# Patient Record
Sex: Female | Born: 1970 | Hispanic: No | Marital: Single | Smoking: Current every day smoker
Health system: Southern US, Community
[De-identification: ages and names within clinical notes are randomized; demographics above are authoritative.]

## PROBLEM LIST (undated history)

## (undated) DIAGNOSIS — I10 Essential (primary) hypertension: Secondary | ICD-10-CM

## (undated) DIAGNOSIS — K219 Gastro-esophageal reflux disease without esophagitis: Secondary | ICD-10-CM

## (undated) DIAGNOSIS — J45909 Unspecified asthma, uncomplicated: Secondary | ICD-10-CM

## (undated) DIAGNOSIS — R0602 Shortness of breath: Secondary | ICD-10-CM

## (undated) DIAGNOSIS — M1990 Unspecified osteoarthritis, unspecified site: Secondary | ICD-10-CM

## (undated) DIAGNOSIS — T8859XA Other complications of anesthesia, initial encounter: Secondary | ICD-10-CM

## (undated) DIAGNOSIS — F32A Depression, unspecified: Secondary | ICD-10-CM

## (undated) DIAGNOSIS — I739 Peripheral vascular disease, unspecified: Secondary | ICD-10-CM

## (undated) DIAGNOSIS — K829 Disease of gallbladder, unspecified: Secondary | ICD-10-CM

## (undated) DIAGNOSIS — F410 Panic disorder [episodic paroxysmal anxiety] without agoraphobia: Secondary | ICD-10-CM

## (undated) DIAGNOSIS — I209 Angina pectoris, unspecified: Secondary | ICD-10-CM

## (undated) DIAGNOSIS — I509 Heart failure, unspecified: Secondary | ICD-10-CM

## (undated) DIAGNOSIS — F419 Anxiety disorder, unspecified: Secondary | ICD-10-CM

## (undated) DIAGNOSIS — I219 Acute myocardial infarction, unspecified: Secondary | ICD-10-CM

## (undated) DIAGNOSIS — R52 Pain, unspecified: Secondary | ICD-10-CM

## (undated) DIAGNOSIS — F329 Major depressive disorder, single episode, unspecified: Secondary | ICD-10-CM

## (undated) DIAGNOSIS — E119 Type 2 diabetes mellitus without complications: Secondary | ICD-10-CM

## (undated) DIAGNOSIS — I2510 Atherosclerotic heart disease of native coronary artery without angina pectoris: Secondary | ICD-10-CM

## (undated) DIAGNOSIS — C801 Malignant (primary) neoplasm, unspecified: Secondary | ICD-10-CM

## (undated) DIAGNOSIS — T4145XA Adverse effect of unspecified anesthetic, initial encounter: Secondary | ICD-10-CM

## (undated) DIAGNOSIS — F319 Bipolar disorder, unspecified: Secondary | ICD-10-CM

## (undated) HISTORY — PX: CORONARY ANGIOPLASTY: SHX604

## (undated) HISTORY — PX: TUBAL LIGATION: SHX77

## (undated) HISTORY — PX: WRIST SURGERY: SHX841

## (undated) HISTORY — PX: CARDIAC CATHETERIZATION: SHX172

## (undated) HISTORY — PX: DENTAL SURGERY: SHX609

## (undated) HISTORY — PX: TMJ ARTHROPLASTY: SHX1066

---

## 1997-05-24 ENCOUNTER — Ambulatory Visit (HOSPITAL_COMMUNITY): Admission: RE | Admit: 1997-05-24 | Discharge: 1997-05-24 | Payer: Self-pay | Admitting: *Deleted

## 1997-09-09 ENCOUNTER — Ambulatory Visit (HOSPITAL_COMMUNITY): Admission: RE | Admit: 1997-09-09 | Discharge: 1997-09-09 | Payer: Self-pay | Admitting: *Deleted

## 1997-09-24 ENCOUNTER — Inpatient Hospital Stay (HOSPITAL_COMMUNITY): Admission: AD | Admit: 1997-09-24 | Discharge: 1997-09-24 | Payer: Self-pay | Admitting: *Deleted

## 1997-10-01 ENCOUNTER — Inpatient Hospital Stay (HOSPITAL_COMMUNITY): Admission: RE | Admit: 1997-10-01 | Discharge: 1997-10-01 | Payer: Self-pay | Admitting: *Deleted

## 1997-10-08 ENCOUNTER — Inpatient Hospital Stay (HOSPITAL_COMMUNITY): Admission: RE | Admit: 1997-10-08 | Discharge: 1997-10-08 | Payer: Self-pay | Admitting: *Deleted

## 1997-10-16 ENCOUNTER — Other Ambulatory Visit: Admission: RE | Admit: 1997-10-16 | Discharge: 1997-10-16 | Payer: Self-pay | Admitting: *Deleted

## 1997-10-22 ENCOUNTER — Inpatient Hospital Stay (HOSPITAL_COMMUNITY): Admission: AD | Admit: 1997-10-22 | Discharge: 1997-10-25 | Payer: Self-pay | Admitting: Obstetrics

## 2000-04-18 ENCOUNTER — Emergency Department (HOSPITAL_COMMUNITY): Admission: EM | Admit: 2000-04-18 | Discharge: 2000-04-18 | Payer: Self-pay | Admitting: Emergency Medicine

## 2000-12-27 ENCOUNTER — Inpatient Hospital Stay (HOSPITAL_COMMUNITY): Admission: EM | Admit: 2000-12-27 | Discharge: 2000-12-29 | Payer: Self-pay | Admitting: *Deleted

## 2000-12-27 ENCOUNTER — Encounter: Payer: Self-pay | Admitting: Emergency Medicine

## 2001-03-08 ENCOUNTER — Emergency Department (HOSPITAL_COMMUNITY): Admission: EM | Admit: 2001-03-08 | Discharge: 2001-03-08 | Payer: Self-pay

## 2001-03-24 ENCOUNTER — Inpatient Hospital Stay (HOSPITAL_COMMUNITY): Admission: EM | Admit: 2001-03-24 | Discharge: 2001-03-26 | Payer: Self-pay | Admitting: Emergency Medicine

## 2001-03-24 ENCOUNTER — Encounter: Payer: Self-pay | Admitting: Emergency Medicine

## 2001-05-14 ENCOUNTER — Emergency Department (HOSPITAL_COMMUNITY): Admission: EM | Admit: 2001-05-14 | Discharge: 2001-05-14 | Payer: Self-pay | Admitting: Emergency Medicine

## 2001-05-23 ENCOUNTER — Inpatient Hospital Stay (HOSPITAL_COMMUNITY): Admission: EM | Admit: 2001-05-23 | Discharge: 2001-05-25 | Payer: Self-pay | Admitting: Emergency Medicine

## 2001-05-23 ENCOUNTER — Encounter: Payer: Self-pay | Admitting: Cardiology

## 2001-07-13 ENCOUNTER — Emergency Department (HOSPITAL_COMMUNITY): Admission: EM | Admit: 2001-07-13 | Discharge: 2001-07-13 | Payer: Self-pay | Admitting: *Deleted

## 2001-07-14 ENCOUNTER — Encounter: Payer: Self-pay | Admitting: Emergency Medicine

## 2001-10-05 ENCOUNTER — Ambulatory Visit (HOSPITAL_COMMUNITY): Admission: RE | Admit: 2001-10-05 | Discharge: 2001-10-05 | Payer: Self-pay | Admitting: Cardiology

## 2001-12-12 ENCOUNTER — Emergency Department (HOSPITAL_COMMUNITY): Admission: EM | Admit: 2001-12-12 | Discharge: 2001-12-12 | Payer: Self-pay | Admitting: Emergency Medicine

## 2002-01-02 ENCOUNTER — Emergency Department (HOSPITAL_COMMUNITY): Admission: EM | Admit: 2002-01-02 | Discharge: 2002-01-02 | Payer: Self-pay | Admitting: Emergency Medicine

## 2002-07-04 ENCOUNTER — Observation Stay (HOSPITAL_COMMUNITY): Admission: EM | Admit: 2002-07-04 | Discharge: 2002-07-05 | Payer: Self-pay | Admitting: Emergency Medicine

## 2002-07-04 ENCOUNTER — Encounter: Payer: Self-pay | Admitting: Emergency Medicine

## 2002-07-05 ENCOUNTER — Encounter: Payer: Self-pay | Admitting: Internal Medicine

## 2003-10-29 ENCOUNTER — Emergency Department (HOSPITAL_COMMUNITY): Admission: EM | Admit: 2003-10-29 | Discharge: 2003-10-29 | Payer: Self-pay | Admitting: Emergency Medicine

## 2003-12-03 ENCOUNTER — Inpatient Hospital Stay (HOSPITAL_COMMUNITY): Admission: EM | Admit: 2003-12-03 | Discharge: 2003-12-06 | Payer: Self-pay | Admitting: *Deleted

## 2003-12-19 ENCOUNTER — Ambulatory Visit: Payer: Self-pay | Admitting: Cardiology

## 2004-01-07 ENCOUNTER — Emergency Department (HOSPITAL_COMMUNITY): Admission: EM | Admit: 2004-01-07 | Discharge: 2004-01-07 | Payer: Self-pay | Admitting: Emergency Medicine

## 2004-03-02 ENCOUNTER — Emergency Department (HOSPITAL_COMMUNITY): Admission: EM | Admit: 2004-03-02 | Discharge: 2004-03-02 | Payer: Self-pay | Admitting: Emergency Medicine

## 2006-01-22 ENCOUNTER — Ambulatory Visit: Payer: Self-pay | Admitting: Cardiovascular Disease

## 2006-01-22 ENCOUNTER — Inpatient Hospital Stay (HOSPITAL_COMMUNITY): Admission: EM | Admit: 2006-01-22 | Discharge: 2006-01-24 | Payer: Self-pay | Admitting: Emergency Medicine

## 2006-02-14 ENCOUNTER — Ambulatory Visit: Payer: Self-pay | Admitting: Cardiology

## 2006-03-07 ENCOUNTER — Ambulatory Visit: Payer: Self-pay | Admitting: Cardiology

## 2006-05-18 ENCOUNTER — Ambulatory Visit: Payer: Self-pay | Admitting: Internal Medicine

## 2006-06-02 ENCOUNTER — Ambulatory Visit: Payer: Self-pay | Admitting: Cardiology

## 2007-02-24 ENCOUNTER — Emergency Department (HOSPITAL_COMMUNITY): Admission: EM | Admit: 2007-02-24 | Discharge: 2007-02-24 | Payer: Self-pay | Admitting: Emergency Medicine

## 2008-09-04 ENCOUNTER — Emergency Department (HOSPITAL_COMMUNITY): Admission: EM | Admit: 2008-09-04 | Discharge: 2008-09-04 | Payer: Self-pay | Admitting: Emergency Medicine

## 2009-01-24 ENCOUNTER — Ambulatory Visit (HOSPITAL_COMMUNITY): Admission: RE | Admit: 2009-01-24 | Discharge: 2009-01-24 | Payer: Self-pay | Admitting: Orthopaedic Surgery

## 2009-02-07 ENCOUNTER — Ambulatory Visit (HOSPITAL_COMMUNITY): Admission: RE | Admit: 2009-02-07 | Discharge: 2009-02-07 | Payer: Self-pay | Admitting: Orthopaedic Surgery

## 2010-06-10 ENCOUNTER — Emergency Department (HOSPITAL_COMMUNITY): Payer: Medicaid Other

## 2010-06-10 ENCOUNTER — Emergency Department (HOSPITAL_COMMUNITY)
Admission: EM | Admit: 2010-06-10 | Discharge: 2010-06-10 | Discharge status: Against Medical Advice | Payer: Medicaid Other | Attending: Emergency Medicine | Admitting: Emergency Medicine

## 2010-06-10 DIAGNOSIS — R059 Cough, unspecified: Secondary | ICD-10-CM | POA: Insufficient documentation

## 2010-06-10 DIAGNOSIS — R05 Cough: Secondary | ICD-10-CM | POA: Insufficient documentation

## 2010-06-10 DIAGNOSIS — I2510 Atherosclerotic heart disease of native coronary artery without angina pectoris: Secondary | ICD-10-CM | POA: Insufficient documentation

## 2010-06-10 DIAGNOSIS — I10 Essential (primary) hypertension: Secondary | ICD-10-CM | POA: Insufficient documentation

## 2010-06-10 DIAGNOSIS — I252 Old myocardial infarction: Secondary | ICD-10-CM | POA: Insufficient documentation

## 2010-06-10 DIAGNOSIS — R0789 Other chest pain: Secondary | ICD-10-CM | POA: Insufficient documentation

## 2010-06-10 LAB — POCT CARDIAC MARKERS
Myoglobin, poc: 40.2 ng/mL (ref 12–200)
Troponin i, poc: <0.05 ng/mL (ref 0.00–0.09)

## 2010-06-10 LAB — COMPREHENSIVE METABOLIC PANEL
ALT: 31 U/L (ref 0–35)
AST: 31 U/L (ref 0–37)
BUN: 13 mg/dL (ref 6–23)
CO2: 21 mEq/L (ref 19–32)
Calcium: 8.7 mg/dL (ref 8.4–10.5)
Chloride: 106 mEq/L (ref 96–112)
Creatinine, Ser: 0.79 mg/dL (ref 0.4–1.2)
Glucose, Bld: 124 mg/dL — ABNORMAL HIGH (ref 70–99)
Total Protein: 6.9 g/dL (ref 6.0–8.3)

## 2010-06-10 LAB — DIFFERENTIAL
Basophils Absolute: 0 10*3/uL (ref 0.0–0.1)
Lymphocytes Relative: 31 % (ref 12–46)
Monocytes Relative: 7 % (ref 3–12)
Neutrophils Relative %: 60 % (ref 43–77)

## 2010-06-10 LAB — CBC
MCHC: 34.6 g/dL (ref 30.0–36.0)
Platelets: 201 10*3/uL (ref 150–400)
RBC: 5.1 MIL/uL (ref 3.87–5.11)
WBC: 6.9 10*3/uL (ref 4.0–10.5)

## 2010-06-10 LAB — ETHANOL: Alcohol, Ethyl (B): <5 mg/dL (ref 0–10)

## 2010-06-10 LAB — RAPID URINE DRUG SCREEN, HOSP PERFORMED: Amphetamines: NOT DETECTED

## 2010-06-24 LAB — BASIC METABOLIC PANEL
BUN: 8 mg/dL (ref 6–23)
CO2: 25 mEq/L (ref 19–32)
Calcium: 9.1 mg/dL (ref 8.4–10.5)
Chloride: 108 mEq/L (ref 96–112)
Creatinine, Ser: 0.78 mg/dL (ref 0.4–1.2)
GFR calc Af Amer: >60 mL/min (ref >60)
Glucose, Bld: 119 mg/dL — ABNORMAL HIGH (ref 70–99)
Potassium: 3.6 mEq/L (ref 3.5–5.1)
Sodium: 142 mEq/L (ref 135–145)

## 2010-06-24 LAB — CBC
HCT: 45.6 % (ref 36.0–46.0)
HCT: 47.1 % — ABNORMAL HIGH (ref 36.0–46.0)
Hemoglobin: 15.8 g/dL — ABNORMAL HIGH (ref 12.0–15.0)
MCHC: 34.7 g/dL (ref 30.0–36.0)
MCV: 94.9 fL (ref 78.0–100.0)
MCV: 94.6 fL (ref 78.0–100.0)
RBC: 4.81 MIL/uL (ref 3.87–5.11)
RDW: 13.1 % (ref 11.5–15.5)
WBC: 7.3 10*3/uL (ref 4.0–10.5)

## 2010-08-07 NOTE — Cardiovascular Report (Signed)
NAME:  Christine Davidson, Christine Davidson                          ACCOUNT NO.:  [id number]   MEDICAL RECORD NO.:  [id number]                   PATIENT TYPE:  INP   LOCATION:  2923                                 FACILITY:  [facility]   PHYSICIAN:  Salvadore Farber, M.D. [facility]         DATE OF BIRTH:  1971-02-07   DATE OF PROCEDURE:  12/03/2003  DATE OF DISCHARGE:                              CARDIAC CATHETERIZATION   PROCEDURE:  Left heart catheterization, left ventriculogram, coronary  angiography.   INDICATIONS FOR PROCEDURE:  Christine Davidson is a 40 year old lady with coronary  artery disease status post stenting of her circumflex in 2002 who now  presents with acute inferior myocardial infarction.  The pain onset was at  noon.  ER presentation was at 120 p.m.  She has ongoing chest discomfort and  electrocardiogram shows inferior ST elevations with reciprocal changes  anteriorly.  She is brought to the cardiac cath lab for urgent  catheterization with an eye to percutaneous revascularization.   PROCEDURE TECHNIQUE:  Informed consent was obtained.  Under 1% lidocaine  local anesthesia, a 6 French sheaths were placed in the right common femoral  artery and vein using the modified Seldinger technique.  Diagnostic  angiography was performed of the left system using a JL4 catheter and of the  right system using a 6 [place] JR4 guide.  These demonstrated subtotal  occlusion of the proximal RCA with TIMI II flow and heavy thrombus burden.   The patient had been initiated on heparin in the emergency room  and arrived  with an ACT of 260.  Double bolus eptifibatide was administered upon arrival  to the cardiac catheterization lab.  The lesion was crossed with a Luge wire  without difficulty.  It was then predilated using a 2 by 12 mm Maverick at 6  atmospheres for two inflations.  There remained heavy residual thrombus  burden.  An export catheter was then used to perform thrombectomy.  Several  moderate size pieces  of atherosclerotic debris were removed.  There was  moderate residual thrombus after this.  The lesion was then stented using a  3 by 23 mm Vision deployed at 16 atmospheres (bare metal stent was used due  to patient's history of noncompliance with medical therapy and substance  abuse).  The stent was then post dilated using a 3.25 by 18 mm Powersail at  16 atmospheres for two inflations.  Final angiography demonstrated no  residual stenosis and TIMI III flow to the distal vasculature.  The pigtail  catheter was then used to perform left heart catheterization and  ventriculography.  The patient tolerated the procedure well and was  transferred to the cardiac intensive care unit in stable condition.   COMPLICATIONS:  None.   FINDINGS:  1.  Left ventricle:  120/16/20. EF 45% with posterobasal akinesis.  2.  No mitral regurgitation or aortic stenosis.  3.  Left main:  Angiographically normal.  4.  LAD:  Moderate size vessel giving rise to a single diagonal.  It is      angiographically normal.  5.  Ramus intermedius:  Moderate size vessel which is angiographically      normal.  6.  Circumflex:  Moderate size vessel giving rise to a single obtuse      marginal.  This marginal has a stent in its mid section.  The stent is      widely patent with less than 20% stent restenosis.  7.  Right coronary artery:  Large, dominant vessel.  There was 99%      thrombotic stenosis in the proximal vessel overlapping the take off of      the sole acute marginal.  This lesion was stented to no residual      stenosis with establishment of TIMI III flow.   IMPRESSION/PLAN:  Successful bare metal stent placement in the culprit  lesion of the RCA.  The patient will be continued on eptifibatide for 18  hours.  Plavix should be continued for a minimum of four weeks, preferably  for one year.  She will be admitted to the cardiac intensive care unit where  she will be continued on aspirin, Plavix, eptifibatide.   Statin and beta  blocker will be initiated.  Urine toxicology screen will be sent.                                               Salvadore Farber, M.D. [facility]    WED/MEDQ  D:  12/03/2003  T:  12/03/2003  Job:  [id number]   cc:   Charlies Constable, M.D. [facility]

## 2010-08-07 NOTE — H&P (Signed)
NAME:  Christine Davidson, Christine Davidson                          ACCOUNT NO.:  [id number]   MEDICAL RECORD NO.:  [id number]                   PATIENT TYPE:  INP   LOCATION:  1823                                 FACILITY:  [facility]   PHYSICIAN:  Salvadore Farber, M.D. [facility]         DATE OF BIRTH:  1970-08-23   DATE OF ADMISSION:  12/03/2003  DATE OF DISCHARGE:                                HISTORY & PHYSICAL   CHIEF COMPLAINT:  Chest pain.   BRIEF HISTORY:  This is a 40 year old female who presented to [facility] Emergency Room today for evaluation of chest pain.  Her EKG was  consistent with an acute MI.  The patient identifies Dr. Charlies Constable as her  cardiologist.  She has had a previous MI, details pending.  The patient's  pain started around noon today.  Apparently, she was having an argument with  her husband.  She developed chest pain.  The patient is unable to give much  history.  She is agitated at this time as well as having nausea and  vomiting.  Much of the history is obtained from her cousin.  Apparently, the  patient has a substantial psychiatric history as well as a history of  substance abuse.  She will be taken directly to the cardiac catheterization  laboratory.   PAST MEDICAL HISTORY:  Significant for coronary artery disease with previous  lateral wall MI in October 2002, status post stenting.  She had a  percutaneous intervention in March 2003, as well.  Details are pending.  She  has a history of hypertension, ongoing tobacco use, psychiatric history with  anxiety and depression, and a history of gastroesophageal reflux disease.   ALLERGIES:  MORPHINE causes ARM SWELLING and WELTS.  She states she is  ALLERGIC to MOST PAIN MEDICATIONS, except DILAUDID.  She is also ALLERGIC to  CODEINE AND VICODIN.   MEDICATIONS:  Prior to admission, unclear.  Apparently, she was on:  1.  Aspirin.  2.  Prevacid.  3.  Seroquel.  4.  Remeron.   Dosages not available.   SOCIAL  HISTORY:  The patient lives in [place].  She has one daughter.  She is  married.  She smokes a pack of cigarettes per day.  She does not use  alcohol.  She does not work.   FAMILY HISTORY:  The patient's father is living.  He is in his 40s.  He has  had previous MIs, diabetes mellitus, and coronary artery bypass graft  surgery.  Her mother is in her 26s.  She is alive and well.  She has two  brothers alive and well.   REVIEW OF SYSTEMS:  Unobtainable.   PHYSICAL EXAMINATION:  GENERAL:  Per Dr. Samule Ohm, revealed an extremely  anxious, agitated, Spanish-appearing 40 year old female.  VITAL SIGNS:  Blood pressure 120/80, pulse 70s.  HEENT:  Unremarkable.  NECK:  Reveals no bruits, no jugular venous distention.  HEART:  Reveals regular rate and rhythm without murmur.  LUNGS:  Clear.  ABDOMEN:  Obese, soft, nontender.  EXTREMITIES:  Reveal pulses to be intact without significant edema.  NEUROLOGIC:  Grossly intact.   LABORATORY DATA:  An EKG shows sinus rhythm, rate 75 beats per minute with  inferior ST elevation.  A CBC reveals hemoglobin 16.4, hematocrit 46, WBC  12.2000, platelets 260,000.  Other laboratories are pending at this time.   IMPRESSION:  1.  Chest pain, probable acute inferior myocardial infarction.  2.  History of previous coronary artery disease as documented above.  3.  History of hypertension.  4.  History of tobacco use.  5.  History of substance abuse including marijuana and cocaine.  6.  Psychiatric history with history of anxiety and depression.  7.  Gastroesophageal reflux disease.   PLAN:  The patient will be taken to the cardiac catheterization laboratory  for catheterization and possible intervention.  I have tried to call her  psychiatrist or the physician she identifies as her psychiatrist, Dr. Elna Breslow.  He apparently is no longer in practice.  We will start her back on  Seroquel and Remeron at low dosages.  We will continue her Prevacid and  aspirin.   We will not use a beta blocker due to a history of cocaine use.      Delton See, P.A. [facility]                  Salvadore Farber, M.D. [facility]    DR/MEDQ  D:  12/03/2003  T:  12/03/2003  Job:  [id number]

## 2010-08-07 NOTE — Discharge Summary (Signed)
[place]. [facility]  Patient:    Christine Davidson, Christine Davidson Visit Number: [id number] MRN: [id number]          Service Type: MED Location: [phone] Attending Physician:  Mirian Mo Dictated by:   Rozell Searing, P.A. Admit Date:  03/24/2001 Discharge Date: 03/26/2001                             Discharge Summary  PROCEDURES: None.  HISTORY OF PRESENT ILLNESS: Ms. Cushman is a 40 year old female, status post recent lateral myocardial infarction, treated with stenting of the OM, in the setting of cocaine use, who presented with recurrent chest discomfort.  HOSPITAL COURSE: Drug screen was positive for benzodiazepines and cannabis, but negative for cocaine.  Serial cardiac enzymes were negative.  The patient was initially treated with IV nitroglycerin with reported relief. However, following rule out of MI plan was to proceed with conservative management and the patient had no recurrent chest pain once IV nitroglycerin was discontinued.  MEDICATION ADJUSTMENTS: Addition of Nicotrol patch to assist the patient in stopping tobacco smoking.  FOLLOW-UP: We will make arrangements for an outpatient Cardiolite prior to follow-up with Dr. Antoine Poche, the patients primary cardiologist.  The patient will be contacted by our office for an outpatient stress Cardiolite in the next two weeks.  She will then follow up with Dr. Antoine Poche.  DISCHARGE MEDICATIONS:  1. Plavix 75 mg q.d.  2. Aspirin 81 mg q.d.  3. Norvasc 5 mg q.d.  4. Zoloft 75 mg q.d.  5. Xanax as previously directed.  6. Nicotrol patch as directed instructions.  DISCHARGE DIAGNOSES:  1. Nonischemic chest pain.     a. Normal serial cardiac enzymes.  2. Coronary artery disease.     a. Status post lateral myocardial infarction/stent to obtuse marginal        October 2002.     b. Positive cocaine.  3. Polysubstance abuse.  4. Tobacco.  5. Anxiety. Dictated by:   Rozell Searing, P.A. Attending  Physician:  Mirian Mo DD:  03/26/01 TD:  03/26/01 Job: [id number]

## 2010-08-07 NOTE — Discharge Summary (Signed)
NAME:  Christine Davidson, Christine Davidson                          ACCOUNT NO.:  [id number]   MEDICAL RECORD NO.:  [id number]                   PATIENT TYPE:  INP   LOCATION:  2030                                 FACILITY:  [facility]   PHYSICIAN:  Salvadore Farber, M.D. [facility]         DATE OF BIRTH:  [birth year]   DATE OF ADMISSION:  12/03/2003  DATE OF DISCHARGE:  12/06/2003                           DISCHARGE SUMMARY - REFERRING   SUMMARY OF HISTORY:  Christine Davidson is a 40 year old female who presents to  [facility] Emergency Room complaining of chest discomfort.  She got in an  argument with her husband and developed chest discomfort associated with  nausea and vomiting.  She was very agitated at the time of evaluation.  She  was unable to give much history.  She describes it as a crushing sensation  also associated with diaphoresis.  EKG showed an acute MI.  Her history is  notable for hypertension, prior myocardial infarction and intervention with  Dr. Juanda Chance, continued tobacco use, and prior drug history.   LABORATORY DATA:  Fasting lipids showed a total cholesterol 166,  triglycerides 92, HDL 51, LDL 97.  Admission CK total was 541 with an MB of  68.7, relative index 12.7.  Sodium 138, potassium 3.7, BUN 9, creatinine  0.9.  H&H 15.3 and 45.0.  Subsequent chemistries and hematologies during her  admission were unremarkable.  Second CK total was 973 with an MB of 172.5.  EKG on admission showed normal sinus rhythm, normal axis, ST segment  elevations inferiorly with ST segment elevations in V1 and V2.  Subsequent  EKGs showed evolving inferior myocardial infarction.   HOSPITAL COURSE:  [name] was taken emergently to the catheterization  laboratory by Dr. Samule Ohm.  According to his progress note EF was 45% with  posterobasilar akinesis.  She had a stent to a circumflex branch that was  widely patent.  She had a 99% proximal RCA lesion with thrombus.  Utilizing  stent this was reduced to 0%.  Post  catheterization site was intact per  Lavella Hammock.  Medications were adjusted.  She was admitted to the  coronary care unit.  Overnight she did not have any further discomfort.  She  ruled in for myocardial infarction by enzymes and EKG.  Medications  continued to be adjusted.  Cardiac rehabilitation assisted with education  and ambulation.  Smoking cessation consult was also performed.  She was  transferred to 2000 on September 15.  At the time of the transfer it was  noted that she probably had a slight amount of mild congestive failure and  she was treated with some diuretics with improvement.  By September 16 Dr.  Gerri Spore reviewed the patient and it was felt that she could be discharged  home.  At the time of discharge H&H was 15.4 and 40.6, platelets 213, WBC  7.2.  Sodium 140, potassium 3.5, BUN 12, creatinine 0.8, glucose 98.  DISCHARGE DIAGNOSES:  1.  Acute inferior myocardial infarction status post stenting to the      proximal right coronary artery with residual ejection fraction of 45%      with posterobasilar akinesis.  2.  Hypertension.  3.  Tobacco use.  4.  Anxiety.  5.  Depression.  6.  Hyperlipidemia.  7.  Mild congestive heart failure.  8.  Drug use as urine drug screen was positive for THC and benzodiazepines.   DISPOSITION:  Patient is discharged home.  She received new prescriptions  for Plavix 75 mg daily, Lipitor 80 mg daily, Lopressor 50 mg half tablet  b.i.d., enalapril 5 mg daily, and nitroglycerin 0.4 as needed.  She was  given permission to continue her aspirin 325 daily, Seroquel 25 b.i.d.,  Remeron 15 mg q.h.s.  She was advised no lifting, driving, sexual activity,  or heavy exertion for the next two weeks.  Maintain a low salt, fat,  cholesterol diet.  If she has any problems with her catheterization site she  was asked to call us immediately.  She was advised no smoking or tobacco  products and to avoid any illicit drugs.  She will follow up with  Dr. Juanda Chance  on September 29 at 10:45 p.m.  At the time of follow-up with Dr. Juanda Chance  determination of the duration of the Plavix should be decided.  Fasting  lipids and LFTs will need to be arranged in the next six to eight weeks  since Lipitor was initiated.  It is of note that the patient also needs to  find a primary care physician for continued follow-up.  She is considering  the internal medicine clinic at [facility].       EW/MEDQ  D:  12/06/2003  T:  12/06/2003  Job:  [id number]   cc:   Carole Binning, M.D. [facility]   Charlies Constable, M.D. [facility]

## 2010-08-07 NOTE — H&P (Signed)
[place]. [facility]  Patient:    Christine Davidson, Christine Davidson Visit Number: [id number] MRN: [id number]          Service Type: MED Location: 2000 2025 01 Attending Physician:  Rollene Rotunda Dictated by:   Noralyn Pick. Eden Emms, M.D. [facility] Admit Date:  05/23/2001                           History and Physical  HISTORY OF PRESENT ILLNESS:  [name] is a 40 year old patient of Dr. Algis Downs Karle Plumber and Dr. Rollene Rotunda.  She has had previous circumflex infarction in October 2002 due to an OM occlusion.  At the time, she was positive for cocaine.  Her hospital stay at that time was otherwise unremarkable.  She denies recent drug use.  She awoke today with chest pain similar to her previous pain.  It was relieved with nitroglycerin x2 but returned.  She came to the emergency room here and had relief with intravenous nitroglycerin.  PAST MEDICAL HISTORY:  Her past medical history is otherwise remarkable for anxiety.  MEDICATIONS: 1. Zoloft 100 mg a day. 2. Norvasc 5 mg a day. 3. An aspirin a day. 4. Plavix 75 mg a day. 5. Xanax p.r.n.  ALLERGIES:  She is allergic to MORPHINE and CODEINE.  PHYSICAL EXAMINATION:  GENERAL:  The patient on admission was pain-free.  VITAL SIGNS:  Heart rate was 72, blood pressure was 130/80.  LUNGS:  Clear.  CARDIAC:  Carotids normal.  There was an S1 and S2 without murmur, rub, gallop or click.  ABDOMEN:  Benign.  EXTREMITIES:  Lower extremities with intact pulses and no edema.  LABORATORY AND ACCESSORY DATA:  EKG showed sinus rhythm with no acute changes.  Labs are pending at this time.  IMPRESSION:  Recurrent chest pain.  The patient continues to smoke.  She has had a previous obtuse marginal infarction in October 2002.  She will need repeat heart catheterization to further assess the patency of her stent.  We will treat her with Lovenox and continue beta blocker therapy with Lopressor 25 mg b.i.d.  She will be ruled out  for myocardial infarction and we will get a smoking cessation consult. Dictated by:   Noralyn Pick Eden Emms, M.D. [facility] Attending Physician:  Rollene Rotunda DD:  05/23/01 TD:  05/23/01 Job: [id number]

## 2010-08-07 NOTE — Discharge Summary (Signed)
[place]. [facility]  Patient:    Christine Davidson, Christine Davidson Visit Number: [id number] MRN: [id number]          Service Type: MED Location: [phone] Attending Physician:  Rollene Rotunda Dictated by:   Guy Franco, P.A.-C. Admit Date:  05/23/2001 Disc. Date: 05/25/01                    Referring Physician Discharge [place]  DISCHARGE DIAGNOSES: 1. Coronary artery disease, status post PCI to circumflex lesion on    May 24, 2001, by Dr. Veneda Melter. 2. Substance abuse. 3. Anxiety disorder.  HISTORY:  [name] is a 40 year old female with known coronary artery disease.  HOSPITAL COURSE:  She was admitted on May 23, 2001, with chest pain and did rule out for myocardial infarction.  Her EKG revealed normal sinus rhythm, otherwise normal.  Her catheterization on May 24, 2001, by Dr. Veneda Melter revealed normal right coronary artery, normal LAD, circumflex with a large OM-1 with a 70% in-stent restenosis.  She underwent PCI of this lesion to a 0% stenosis postprocedure with TIMI 3 flow.  She remained in the hospital overnight for observation.  She remained stable and upon discharge, was able to ambulate without pain or discomfort.  LABORATORY STUDIES:  BUN 4, creatinine 0.5.  Sodium 140, potassium 3.8. Hemoglobin 14.3, hematocrit 41.0, platelets 161, and white count 6.2.  She was discharged to home on May 25, 2001, in stable condition.  DISCHARGE MEDICATIONS: 1. Zoloft 100 mg q.d. 2. Enteric-coated aspirin 325 mg 1 p.o. q.d. 3. Plavix 75 mg 1 q.d. 4. Xanax p.r.n. 5. Seroquel 300 mg q.h.s. 6. Lopressor 25 mg 1 p.o. b.i.d. 7. Nitroglycerin p.r.n. chest pain.  She was on Norvasc 5 mg q.d. prior to her admission.  However, this has been switched over to a beta blocker.  If her blood pressure elevates in the office, she will need to be restarted on her Norvasc.  DISCHARGE INSTRUCTIONS: 1. No strenuous activity for two days and then resume regular  activity. 2. Remain on a low fat diet. 3. Clean over her cath site with soap and water. 4. Call for any questions or concern.  FOLLOW-UP:  She has a follow-up appointment with me on June 08, 2001, at 3 p.m. Dictated by:   Guy Franco, P.A.-C. Attending Physician:  Rollene Rotunda DD:  05/25/01 TD:  05/25/01 Job: [id number]

## 2010-08-07 NOTE — Cardiovascular Report (Signed)
[place]. [facility]  Patient:    Christine Davidson, Christine Davidson Visit Number: [id number] MRN: [id number]          Service Type: MED Location: 6500 [phone] Attending Physician:  Rollene Rotunda Dictated by:   Noralyn Pick. Eden Emms, M.D. [facility] Admit Date:  05/23/2001   CC:         Rollene Rotunda, M.D. [facility]   Cardiac Catheterization  PROCEDURE:  Coronary arteriography.  INDICATIONS:  Recurrent chest pain status post stenting of the OM branch in October 2002.  Patient continues to smoke.  DESCRIPTION OF PROCEDURE:  Catheterization was done from the right femoral artery.  A JR 3.5 catheter was used for the right coronary artery.  RESULTS: 1. The left main coronary artery was normal.  2. The left anterior descending artery was normal.  3. The circumflex coronary artery had a 50-60% tubular in-stent restenosis of    a large obtuse marginal branch.  4. The right coronary artery had 30% multiple discrete lesions in the mid    vessel.  The right coronary artery was dominant.  RAO ventriculography:  RAO ventriculography was normal.  Ejection fraction was in the 60% range.  There was no gradient across the aortic valve and no MR. Aortic pressures were in the 129/72 range.  LV pressure was in the 130/15 range.  IMPRESSION:  Films were reviewed with Dr. Chales Abrahams.  He agreed that this early on, it would be worthwhile to re-dilate the in-stent restenosis in the OM.  Th e patient needs to discontinue her smoking habits.  Hopefully things will go well and she will be discharged in the morning.  She will follow up with Dr. Antoine Poche. Dictated by:   Noralyn Pick Eden Emms, M.D. [facility] Attending Physician:  Rollene Rotunda DD:  05/24/01 TD:  05/24/01 Job: [id number]

## 2010-08-07 NOTE — Procedures (Signed)
[place]. [facility]  Patient:    Christine Davidson, Christine Davidson Visit Number: [id number] MRN: [id number]          Service Type: MED Location: 6500 [id number] [id number] Attending Physician:  Rollene Rotunda Dictated by:   Veneda Melter, M.D. [facility] Proc. Date: 05/24/01 Admit Date:  05/23/2001   CC:         Rollene Rotunda, M.D. [facility]   Procedure Report  PROCEDURES PERFORMED:  Percutaneous transluminal coronary angioplasty of the first marginal branch of the left circumflex artery.  DIAGNOSES: 1. Coronary artery disease. 2. In-stent restenosis. 3. Unstable angina.  HISTORY:  Christine Davidson is a 40 year old black female with a known history of coronary artery disease who previously underwent percutaneous intervention with stent placement to a large first marginal branch of the left circumflex artery in October 2002.  She presents now with recurrence of discomfort and was admitted to the hospital with unstable angina.  She underwent cardiac catheterization by Dr. Eden Emms showing in-stent restenosis of 70%.  She presents now for percutaneous intervention.  DESCRIPTION OF PROCEDURE:  Informed consent was obtained.  The patient was brought to the catheterization lab.  The existing #6 [place] sheath was present in the right femoral artery; however, oozing was noted around the sheath and this was exchanged for a #7 [place] sheath.  She was given heparin on a weight-adjusted basis to maintain an ACT greater than 250 seconds.  A #6 [place] Voda left 3.5 guide catheter was then used to engage the left coronary artery and selective guide shots obtained which confirmed the presence of 70% in-stent restenosis in a large first marginal branch of the left circumflex artery.  A 0.014 Extra Sport wire was advanced into the distal vessel and a 3.5- x 10-mm cutting balloon was introduced.  A total of 4 inflations were performed at 10 atmospheres for 30 seconds within the area of stenting. Repeat angiography  was then performed after the administration of intracoronary nitroglycerin showing an excellent result with no residual stenosis, TIMI-3 flow through the left circumflex system and no evidence of distal vessel damage.  The guide catheter was then removed and the sheath secured into position.  The patient tolerated the procedure well and was transferred to the floor in stable condition.  FINAL RESULTS:  Successful percutaneous transluminal coronary angioplasty of the first marginal branch of the left circumflex artery with reduction of a 70% in-stent restenosis to 0% with a 3.5-mm cutting balloon. Dictated by:   Veneda Melter, M.D. [facility] Attending Physician:  Rollene Rotunda DD:  05/24/01 TD:  05/24/01 Job: [id number]

## 2010-08-07 NOTE — Letter (Signed)
May 12, 2006    Jaana Brodt  [address]  [place], [place] [address]   RE:  ADRYANNA, FRIEDT  MRN:  [id number]  /  DOB:  12-23-1970   Dear Ms. Christella Hartigan:   I am writing to follow up on the message that I left on your answering  machine this morning.  You failed to follow up with your appointment at  our office today.  I know that the office had spoken with you personally  just 2 days ago regarding the importance of keeping this appointment.  In addition, you did not show up for your CT scan on February 8.  You  also did not show up when it was rescheduled for the 13th.  I have  previously stressed to you the critical importance of this CT scan to  exclude a lung cancer.  You will recall that I emphasized that to you on  our visit on February 14, 2006.   It is absolutely imperative that you contact our office and obtain the  CT scan that we have recommended.  The office number is [phone].  Any  further missed appointments will unfortunately force me to proceed with  termination of our relationship.    Sincerely,      Salvadore Farber, MD  Electronically Signed    WED/MedQ  DD: 05/12/2006  DT: 05/12/2006  Job #: [id number]

## 2010-08-07 NOTE — H&P (Signed)
Christine Davidson, Christine Davidson                ACCOUNT NO.:  [id number]   MEDICAL RECORD NO.:  [id number]          PATIENT TYPE:  EMS   LOCATION:  MAJO                         FACILITY:  [facility]   PHYSICIAN:  Noralyn Pick. Eden Emms, MD, FACCDATE OF BIRTH:  06-15-1970   DATE OF ADMISSION:  01/22/2006  DATE OF DISCHARGE:                                HISTORY & PHYSICAL   Admission for chest pain.   Christine Davidson is an unfortunate 40 year old patient who had previously been  seen by Dr. Juanda Chance and Dr. Samule Ohm.  She has a history of stent to the  circumflex in 2002, which was a bare metal.  She subsequently also, I  believe, had a bare metal stent  for an anterior wall MI in 2005.  This was  placed in the right coronary artery.  She has an EF in the 45% range.   The patient came to the emergency room with increasing substernal chest pain  over the last 48 hours, which had a crescendo pattern this morning.  The  pain is similar to her previous MI.  The patient unfortunately has no  insurance.  She has applied for Medicaid.  She has not had any health care  and has not taken any medicines in the last two years.  She is currently  painfree.  Initial point-of-care markers are negative.   Patient has had a history of drug abuse.  She tells me she last did pot two  weeks ago and last did cocaine two months ago.  She continues to smoke 1-2  packs of cigarettes a day.   Coronary risk factors otherwise include hyperlipidemia, positive family  history for coronary disease.   Review of systems otherwise remarkable for lack of fever.  She has a minor  cough from her smoking, but it is nonproductive.  She has not had any  significant nausea or vomiting, syncope, palpitations, PND, or orthopnea.   She has no known drug allergies.   Family history is remarkable for  coronary disease on her father's side.   The patient is not married.  She does not work.  She has had a drug problem  in the past but tells me she is  drug-free now.   She was on no medications prior to admission.   PHYSICAL EXAMINATION:  VITAL SIGNS:  Blood pressure 120/70, pulse 70 and  regular.  She is afebrile.  HEENT:  Normal.  NECK:  There is no thyromegaly.  There is no lymphadenopathy.  There are no  carotid bruits.  LUNGS:  Clear.  HEART:  Carotids are normal.  There is an S1 and S2 with normal heart  sounds.  ABDOMEN:  Benign.  LOWER EXTREMITIES:  Intact pulse.  No edema.   EKG shows old IMI with no acute changes.   Chest x-ray shows bilateral atelectasis.   Labs are pending, but point-of-care markers are negative.   IMPRESSION:  Substernal chest pain in a noncompliant patient with previous  bare metal stents to the circumflex and right coronary artery.  Patient will  be admitted to the hospital.  She  will have aspirin and Plavix started.  We  will maintain her on heparin.  We will also resume beta blocker therapy and  statin drug therapy, which she was discharged on in 2005.  She will have a  heart catheterization on Monday.   If the patient needs any further interventions, clearly a non-drug eluting  stents would be indicated.           ______________________________  Noralyn Pick. Eden Emms, MD, [facility]     PCN/MEDQ  D:  01/22/2006  T:  01/22/2006  Job:  [id number]

## 2010-08-07 NOTE — Assessment & Plan Note (Signed)
[facility]                            CARDIOLOGY OFFICE NOTE   NAME:Davidson, Christine MASKELL                       MRN:          [id number]  DATE:06/02/2006                            DOB:          23-Nov-1970    HISTORY OF PRESENT ILLNESS:  Christine Davidson is a 40 year old lady with prior  lateral and inferior myocardial infarctions on two separate occasions.  Both have been treated with bare metal stents. She continues to smoke.  It is not clear when her last cocaine use was. She has missed multiple  appointments despite my nurses phone calls to the patient reminding her  of these appointments. These phone calls were in addition to the usual  automated voice reminder system.   The patient continues to smoke. She said she stopped her statin because  it made her feel bad. She has also stopped her ACE inhibitor and uses  aspirin occasionally. She says she knows that she should take the  aspirin regularly, but forgets.   Fortunately, she has not had any recent chest discomfort, PND,  orthopnea, or edema. She does some mild dyspnea which is chronic.   NECK:  She has no jugular venous distension, thyromegaly, or  lymphadenopathy. Carotid pulse is 2+ bilaterally without bruit.  LUNGS:  Clear to auscultation.  HEART:  She has a non-displaced point of maximal impulse.  There is a  regular rate and rhythm without murmur, rub, or gallop.  ABDOMEN:  Soft, nondistended, nontender.  There is no  hepatosplenomegaly.  Bowel sounds are normal.  EXTREMITIES:  Warm without cyanosis, clubbing, edema, or ulceration.  VITAL SIGNS:  Heart rate 89, blood pressure 135/84, weight 190 pounds.   ELECTROCARDIOGRAM:  Electrocardiogram demonstrates normal sinus rhythm.  It was a normal EKG.   IMPRESSION/RECOMMENDATIONS:  1. Coronary disease: Remote inferior and lateral myocardial      infarctions. Both were treated with bare metal stents. I emphasized      the critical importance of  compliance aspirin at 81 mg daily. I      recommended that she resume Lisinopril 10 mg daily and have      prescribed this for her.  2. Hypercholesterolemia: She has never been compliant with therapy.  3. Ongoing tobacco abuse.  4. History of cocaine abuse.  5. Non-compliance with medical advice and appointments: I have      informed Christine Davidson that the [facility] will no longer be able      to provide care for her. I have informed her that we will continue      to provide care for 30 days from today and would be happy to      provide her medical records to any other caregivers that she      selects. I have informed the patient and her mother that the reason      for the dismissal is her non-compliance with appointments and      medications. She is provided with a previously dictated letter to      this regard. She had not received that. That letter was returned  as      the address she had given was incorrect. We      have handed that letter to her today.  6. Lung nodule: Resolved on recent CAT scan. No other follow up      indicated.     Salvadore Farber, MD  Electronically Signed    WED/MedQ  DD: 06/02/2006  DT: 06/04/2006  Job #: [id number]

## 2010-08-07 NOTE — H&P (Signed)
NAME:  Christine Davidson, Christine Davidson                          ACCOUNT NO.:  [id number]   MEDICAL RECORD NO.:  [id number]                   PATIENT TYPE:  INP   LOCATION:  1846                                 FACILITY:  [facility]   PHYSICIAN:  Duke Salvia, M.D. [facility]           DATE OF BIRTH:  09/13/1970   DATE OF ADMISSION:  07/04/2002  DATE OF DISCHARGE:                                HISTORY & PHYSICAL   CHIEF COMPLAINT:  We were asked by the emergency room to evaluate the  patient for recurrent chest pain.   HISTORY OF PRESENT ILLNESS:  She is a 40 year old woman who appears to be of  [place] descent who had a myocardial infarction a couple of years ago the  records for which are not available.  By her recollection she presented with  dyspnea and without chest pain.  She underwent stenting of her OM.  She then  had problems with recurrent chest pain which she was told that she might  have afterwards.  She was found to have a 50-70% OM lesion that was  submitted for PCI by Dr. Chales Abrahams on 03/03.  In the fall of 2003 she had  recurrent chest pain, had run out of her medications and went to a county  hospital around [place] where some kind of a test was obtained which  prompted her to be transferred to [facility] where catheterization was undertaken  and repeat PCI.  None of these records are available.  She now comes to the  emergency room having felt weak and lousy for the last couple of weeks.  This has been associated with lack of energy and fatigue.  While her  appetite has been good she has been having problems with nausea for the last  3-4 days and vomiting.  This morning these symptoms were significantly  worse.  She went to sleep and she woke up she remained nauseated for the  rest of the afternoon and then had problems with chest pain which she  describes as midsternal with some pleuritic component to it.  It is  associated with palpitations but she continued to have palpitations on  arrival of  EMS where her rhythm was documented to be sinus at 100.  EMS had  been called after she had taken three old nitroglycerin without relief.  Nitro __________ successful than that.   MEDICATIONS:  1. Norvasc.  2. Prevacid.  3. Clindamycin.  4. Seroquel.  5. Nitroglycerin.  6. Mirtazapine.   ALLERGIES:  1. CODEINE.  2. PENICILLIN.  3. MORPHINE.   SOCIAL HISTORY:  She is here with her father and her boyfriend.  There is  a young daughter here with her.  She continues to smoke.  She uses  marijuana.  She denies cocaine.   PAST MEDICAL HISTORY:  1. In addition to the above is notable for an anxiety/depressive disorder.  2. She has reflux disease.  PHYSICAL EXAMINATION:  GENERAL:  She is a young [place] female in no acute  distress.  VITAL SIGNS:  Her blood pressure was 126/65 with a pulse of 88.  HEENT:  Demonstrated no xanthoma.  The neck veins were flat.  The carotids  were brisk and full bilaterally without bruits.  Her teeth were  significantly stained.  BACK:  Without kyphosis, scoliosis.  LUNGS:  Clear.  HEART:  Sounds were regular without murmurs or gallops.  ABDOMEN:  Soft with active bowel sounds without midline pulsation or  hepatomegaly.  Femoral pulses were 2+.  EXTREMITIES:  Distal pulses were intact.  There is no clubbing, cyanosis or  edema.  NEUROLOGIC:  Grossly normal.  SKIN:  Warm and dry.   HOSPITAL COURSE:  Electrocardiogram dated today demonstrated a sinus rhythm  at 93 with intervals of 0.12/0.06/0.32 with an axis of 65 degrees.   Laboratories are notable for urine tox screen positive for opiates, benzo  and marijuana.  Her B-MET was normal.  Her troponin and CK were normal .  Her D-dimmer was elevated at 3.6.  Her CBC was normal.   IMPRESSION:  1. Atypical chest pain proceeded by nausea, vomiting and diaphoresis for the     last couple days, significantly worse this morning.  2. Coronary artery disease.     a. Prior myocardial infarction that was  NOT ASSOCIATED WITH CHEST PAIN.     b. Recurrent chest pain prompting hospitalizations and re-cath one in        03/03 with in stent restenosis (50-70%).     c. [facility] seen 11/03, no records available.  3. Polysubstance abuse cigarettes, marijuana and cocaine in the past but     denies currently.  4. Depression, psychosis, anxiety.  5. Gastroesophageal reflux disease.   DISCUSSION:  The patient has recurrent chest pain.  It is not at all clear  to me that her chest pain is cardiac for a variety of reasons.  The first is  that her MI was not associated with chest pain per her recollection.  The  second is that at least in this situation these symptoms were proceeded for  a fairly prolonged period of time by nausea, fatigue and this morning by  vomiting.  And three the severity of the in stent restenosis that prompted  her catheterization last March seems to my inexperienced eye hard to  reconcile with the rest pain which was described.   She is anticipating having chest pain based on a comment made at the time of  presentation for MI.  She has had multiple catheterizations in the last  year.  I think trying to define a diagnostic strategy that does not require  repeat intervention would be useful.  She apparently has had a aborted  Cardiolite in the office.  I still think that a functional study is the way  to start here unless there is definitive objective evidence for myocardial  injury.   PLAN:  1. We will undertake a 23-hour observation.  2. Serial enzymes.  3. Continue __________ .  4. Gallbladder ultrasound.  5.     Cardiolite.  6. If numbers two and five above are negative, teaching service consult to     establish care.  7. We will take an HCG.  Duke Salvia, M.D. [facility]    SCK/MEDQ  D:  07/05/2002  T:  07/05/2002  Job:  [id number]

## 2010-08-07 NOTE — Cardiovascular Report (Signed)
[place]. [facility]  Patient:    Christine Davidson, Christine Davidson Visit Number: [id number] MRN: [id number]          Service Type: CAT Location: [facility] 2859 01 Attending Physician:  Rollene Rotunda Dictated by:   Rollene Rotunda, M.D. [facility] Proc. Date: 10/05/01 Admit Date:  10/05/2001 Discharge Date: 10/05/2001                          Cardiac Catheterization  DATE OF BIRTH:  Feb 28, 1971  PRIMARY CARE PHYSICIAN:  None.  PROCEDURES:  Left heart catheterization/coronary arteriography.  REASON FOR PRESENTATION:  Evaluate patient with coronary disease and recurrent chest discomfort.  PROCEDURAL NOTE:  Left heart catheterization was performed via the right femoral artery.  The artery was cannulated using an anterior wall puncture.  A #6 French arterial sheath was inserted via the modified Seldinger technique. Preformed Judkins and a pigtail catheter were utilized.  The patient tolerated the procedure well and left the lab in stable condition.  RESULTS:  HEMODYNAMICS: 1. LV 122/17. 2. AO 122/69  CORONARIES: 1. The left main was normal.  2. The LAD had luminal irregularities.  3. The circumflex had a large obtuse marginal with proximal stent that had    diffuse 25% stenosis.  4. The right coronary artery was a large dominant vessel with proximal 25%    stenosis and diffuse luminal irregularities.  LEFT VENTRICULOGRAM:  A left ventriculogram was obtained in the RAO projection.  The EF was 60% with normal wall motion.  CONCLUSIONS:  Minimal coronary plaquing with normal left ventricular function.  PLAN:  Continue secondary risk factor modification.  The patient will be restarted on nitrates and calcium channel blockers.  I will also give her a proton pump inhibitor in case some of her discomfort is GI.  She is encouraged to find a new primary care doctor to follow up for evaluation of nonanginal chest pain. Dictated by:   Rollene Rotunda, M.D. [facility] Attending  Physician:  Rollene Rotunda DD:  10/05/01 TD:  10/10/01 Job: [id number]

## 2010-08-07 NOTE — Discharge Summary (Signed)
[name], [name]                ACCOUNT NO.:  [id number]   MEDICAL RECORD NO.:  [id number]          PATIENT TYPE:  INP   LOCATION:  3743                         FACILITY:  [facility]   PHYSICIAN:  Noralyn Pick. Eden Emms, MD, FACCDATE OF BIRTH:  Jan 31, 1971   DATE OF ADMISSION:  01/22/2006  DATE OF DISCHARGE:  01/24/2006                           DISCHARGE SUMMARY - REFERRING   DISCHARGE DIAGNOSIS:  Noncardiac chest discomfort tobacco use, drug use  abnormal CT in regards to a subpleural nodularity in the lingula; history as  noted below.   PROCEDURES PERFORMED:  Cardiac catheterization on 01/24/2006 by Dr. Eden Emms.   BRIEF HISTORY:  Christine Davidson is a 40 year old female who has been seen in the  past by Drs. Juanda Chance and Samule Ohm with known coronary artery disease stenting  to her circumflex and O2 with a bare metal stent for an anterior myocardial  infarction in 2005.  The bare metal stent was placed in the RCA EF of 45%.  She presents in the emergency room with increasing substernal chest  discomfort over the preceding 2 days, prior to admission.  She felt it was  similar to her myocardial infarction discomfort.   PAST MEDICAL HISTORY:  In addition to above is notable for drug use.  However, she has not smoked pot in a of couple weeks; and she has not used  cocaine for 2 months.  She continues to smoke.   LABORATORY DATA:  Admission H&H is 16.1 and 46.1, normal indices, platelets  213, WBCs 6.5.  Subsequent hematologies were unremarkable, D-dimer slightly  elevated at 1.55.Marland Kitchen  PT was 12.5, INR 0.9.  Admission sodium 139, potassium  3.9, BUN 11, creatinine 0.9, glucose 97.  CK/MBs and troponins were within  normal limits x2.  Urine drug screen was positive for THC.  EKGs showed  normal sinus rhythm, baseline artifact, normal axis, early R wave,  nonspecific ST-T wave changes.  Chest x-ray on admission showed bibasilar  atelectasis.  Chest CT scan was negative for pulmonary embolus.  However,  there was a subpleural nodularity within the lingular segment.  It was not  described on previous films, a 56-month follow-up was suggested.  Mediastinum  and hilar were negative for adenopathy, and normal thoracic aorta.   HOSPITAL COURSE:  Christine Davidson was admitted to 3700 and placed on IV heparin  as well as aspirin, Plavix, beta blocker, and Zocor.  Overnight she ruled  out for myocardial infarction.  She was very restless and anxious; possibly  due to nicotine withdrawal.  She almost signed out AMA; however, after  prescribing a Nicoderm patch and some Xanax; she agreed to stay in the  hospital to seek further evaluation in regards to her chest discomfort.  Catheterization was performed on 01/24/2006 by Dr. Eden Emms without  difficulty.  This revealed a 30% mid-LAD stent in the circumflex and the  proximal RCA were patent EF was 55% with inferobasal hypokinesis.  Dr.  Eden Emms felt her discomfort in her chest was not of cardiac etiology.  After  her bed rest, she was ambulating in the halls without difficulty; and it was  felt that she could be discharged home.  Prior to discharge, case management  will be seeing her to assist with discharge needs.   DISPOSITION:  She is asked to maintain low salt fat cholesterol diet.  Her  activity level and wound care per supplemental discharge sheet.  She was  asked to bring all medications to all follow-up appointments.   DISCHARGE MEDICATIONS:  1. She is asked to continue aspirin 325 mg daily.  2. Nitroglycerin 0.4 p.r.n. chest discomfort.  3. She was given a prescription for Zocor 40 mg q.h.s.  4. She was also given permission the Nicoderm patch 14 mg.  5. She was NOT given a prescription for beta blocker with her recent      cocaine use; if she remains drug-free a beta blocker should be      initiated.   FOLLOWUP:  She will follow up with Dr. Samule Ohm on 02/14/2006 at 3:30.  She  was asked to obtain a primary care physician prior to her  appointment with  Dr. Samule Ohm.  She was advised no smoking or drug use.  She will also need a  chest CT in approximately 3 months to follow up on the nodularity.   DISCHARGE TIME:  Less than 30 minutes.  Note to:  She will need blood work  in approximately 6-8 weeks in regards to FLP and LFTs since no Zocor was  reinitiated.  If she has not taken the medication this blood work will not  need to be done.      Christine Davidson    ______________________________  Noralyn Pick Eden Emms, MD, [facility]    EW/MEDQ  D:  01/24/2006  T:  01/24/2006  Job:  [id number]   cc:   Salvadore Farber, MD

## 2010-08-07 NOTE — H&P (Signed)
[place]. [facility]  Patient:    Christine Davidson, Christine Davidson Visit Number: [id number] MRN: [id number]          Service Type: MED Location: 1800 1844 [id number] Attending Physician:  Pearletha Alfred Dictated by:   Noralyn Pick Eden Emms, M.D. [facility] Admit Date:  03/08/2001                           History and Physical  HISTORY OF PRESENT ILLNESS:  [name] is a 40 year old patient with a recent stent to the obtuse marginal branch on December 27, 2000, by Bruce R. Juanda Chance, M.D.  She had residual 40% intermediate disease.  She had a non-Q-wave MI at that time with an EF of 55%.  She has a history of tobacco abuse and substance abuse.  She denies any recent history of cocaine, but continues to smoke marijuana.  She returns to the ER today with 7/10 chest pain.  The pain is not particularly relieved with nitroglycerin.  While in the emergency room, she appeared to be drug seeking.  The patients monitor showed only sinus rhythm, but no PVCs and her electrocardiogram was totally normal.  PAST MEDICAL HISTORY:  Remarkable for the recent stent of the circumflex coronary artery and her drug abuse.  SOCIAL HISTORY:  The patient is married with one child.  She smokes a pack a day.  She has a history of cocaine abuse and smokes marijuana daily.  REVIEW OF SYSTEMS:  Otherwise remarkable for weight loss.  MEDICATIONS: 1. An aspirin a day. 2. Plavix once a day. 3. Norvasc 5 mg a day. 4. Isordil. 5. Xanax. 6. Zoloft 75 mg a day.  ALLERGIES:  She is allergic to MORPHINE and CODEINE.  PHYSICAL EXAMINATION:  Remarkable for a blood pressure of 130/80 and pulse 77 and regular.  LUNGS:  Clear.  NECK:  Carotids normal.  HEART:  No murmur, rub, gallop, or click.  ABDOMEN:  Nontender and nondistended.  EXTREMITIES:  Intact pulses.  No edema.  LABORATORY DATA:  Remarkable for a potassium of 3.2.  The chest x-ray is unremarkable.  IMPRESSION:  Atypical chest pain with recent stent.  The  patient denies recent drug abuse.  She will have a follow-up catheterization tomorrow morning since the risk of restenosis is highest during the first six months and circumflex ischemia can be silent on EKG.  If her enzymes are positive today or she has EKG changes, we will move her catheterization up to today.  I did discuss with the patient the fact that we would try not to give her any narcotics or Valium-like products given her history of drug abuse. Dictated by:   Noralyn Pick Eden Emms, M.D. [facility] Attending Physician:  Pearletha Alfred DD:  03/08/01 TD:  03/08/01 Job: [id number]

## 2010-08-07 NOTE — Discharge Summary (Signed)
NAME:  Christine Davidson, Christine Davidson                          ACCOUNT NO.:  [id number]   MEDICAL RECORD NO.:  [id number]                   PATIENT TYPE:  INP   LOCATION:  3705                                 FACILITY:  [facility]   PHYSICIAN:  Duke Salvia, M.D. [facility]           DATE OF BIRTH:  [birth year]   DATE OF ADMISSION:  07/04/2002  DATE OF DISCHARGE:  07/05/2002                                 DISCHARGE SUMMARY   DISCHARGE DIAGNOSES:  1. Chest pain, etiology unclear.  2. Questionable history of coronary artery disease with prior history of     myocardial infarction.  3. Polysubstance abuse.  4. Depression.  5. Schizophrenia, undifferentiated type, chronic, in residual phase.  6. Major depressive disorder, recurrent, in remission.  7. Anxiety disorder, NOS versus panic disorder.   HOSPITAL COURSE:  Please see the dictated note by Dr. Graciela Husbands for complete  details.  Briefly, this 40 year old female was seen with atypical chest pain  preceded by nausea and vomiting and diarrhea x3 days.  She was admitted and  her cardiac enzymes were negative.  She does have a history of polysubstance  abuse, and urine drug screen on admission revealed the presence of opiates,  benzodiazepines, and THC.  Apparently, the patient was suspected of taking  several Xanax in her room.  She had several visits by security during her  hospital stay due to unruliness.  She had difficulties with family and  spouse while in the hospital.  Psychiatry was asked to see the patient.  Dr.  Jeanie Sewer saw the patient on 07/05/02.  Dr. Jeanie Sewer felt that the patient  was not a harm to herself or others.  He gave her instructions on how to  reach emergency services if she felt in distress or developed suicidal or  homicidal ideations.  He recommended that she follow up with her regular  psychiatrist.  He also recommended she seek followup with an ADS group with  a 12 step program.  He also recommended she continue her regular  medications  prior to admission.  The patient had a chest CT scan that was negative for a  pulmonary embolism.  Adenosine Cardiolite was performed on 07/05/02, and this  was negative for ischemia or scar with an ejection fraction of 61%.  The  patient was discharged home in stable condition.  She was instructed to  contact our office for a follow up appointment.   LABORATORY DATA:  White blood cell count 7600, hemoglobin 15, hematocrit 43,  platelet count 182,000.  INR 0.9.  Sodium 142, potassium 3.7, chloride 107,  BUN 8, creatinine 0.8.  Alcohol level less then 5.  D-dimer 3.63.  PCO2  42.2, pH 7.408, bicarbonate 27.  Chest CT scan as noted above.   DISCHARGE MEDICATIONS:  1. Norvasc 10 mg daily.  2. Aspirin 81 mg daily.  3. Xanax.  4. Remeron.  5. Seroquel.  6. Metaazepium.  7.  Nitroglycerin p.r.n.   FOLLOWUP:  1. The patient has been advised to follow up with her regular psychiatrist     as soon as possible.  2. She has also been asked to seek referral to an ADS with a 12 step     program.  3.     She has also been advised to continue with recommendations as given by Dr.     Jeanie Sewer for seeking followup for distress.  4. She should contact our office for a followup with Dr. Antoine Poche in the     next two to four weeks.     Tereso Newcomer, P.A.                        Duke Salvia, M.D. [facility]    SW/MEDQ  D:  07/05/2002  T:  07/05/2002  Job:  [id number]

## 2010-08-07 NOTE — Assessment & Plan Note (Signed)
[facility]                              CARDIOLOGY OFFICE NOTE   NAME:Dessert, Christine Davidson                       MRN:          [id number]  DATE:02/14/2006                            DOB:          10-Jul-1970    PRIMARY CARE PHYSICIAN:  None.   HISTORY OF PRESENT ILLNESS:  Christine Davidson is a 40 year old lady with prior  lateral and inferior myocardial infarctions on 2 separate episodes. Both  have been treated with bare metal stents. She re-presented with chest pain  last month. Dr. Eden Emms performed a cardiac catheterization demonstrating  preserved left ventricular systolic function with infra-basal hypokinesis.  Stents were seen to be patent. CT angiogram showed no evidence of pulmonary  embolus. She was discharged home. She has done reasonably well since that  time. She has had some brief episodes of chest discomfort, but she feels  that these are different than her pain that accompanied her myocardial  infarction and much less severe than pain which she had previously.   She had applied for disability on the basis of emotional distress and her  coronary disease. She says that Dr. Juanda Chance had previously advised her to  seek disability.   HABITS:  Continues to smoke at least a half pack per day. Last used cocaine  3-4 months ago she says.   CURRENT MEDICATIONS:  Aspirin 325 mg per day. She never filled the  prescription for the Zocor. Beta-blocker was not prescribed during  hospitalization due to her history of cocaine abuse.   PHYSICAL EXAMINATION:  She is generally well-appearing in no distress with a  heart rate of 94, blood pressure of 156/84, and weight of 181 pounds.  She has no jugulovenous distention. No thyromegaly.  LUNGS:  Are clear to auscultation.  She has a nondisplaced point of maximal cardiac impulse. There is a regular  rate and rhythm without murmur, rub or gallop.  ABDOMEN: Is soft, nondistended and nontender. There is no  hepatosplenomegaly. Bowel sounds are normal.  EXTREMITIES: Are warm without clubbing, cyanosis, edema or ulceration.  Carotid pulses are 2+ bilaterally without bruit.   An electrocardiogram demonstrates a normal sinus rhythm and is a normal EKG.   IMPRESSIONS/RECOMMENDATIONS:  1. Coronary artery disease with remote inferior and lateral myocardial      infarctions, both treated with bare metal stents.  2. Hypercholesterolemia.  3. Ongoing tobacco abuse.  4. History of cocaine abuse.  5. Anxiety disorder.   RECOMMENDATIONS:  1. Generally doing fairly well. Blood pressure higher than I would like.      Will add lisinopril 10 mg per day ([employer] program advised). Continue      aspirin. Despite her prior myocardial infarction, I think her history      of repetitive cocaine use even after her myocardial infarctions, bodes      ill for her ability to stay off of the cocaine over the long haul.      Will, therefore, not start beta blockade to avoid the risk of unopposed      alpha. I did advise her to fill her Zocor.  Further, strongly advised      her to quit smoking.  2. Sub-centimeter nodule noted within lingula during hospitalization. Plan      3 month followup computed tomography. Critical importance of following      up for this study was emphasized to Christine Christella Hartigan. She voiced      understanding.     Salvadore Farber, MD  Electronically Signed    WED/MedQ  DD: 02/14/2006  DT: 02/14/2006  Job #: [id number]

## 2010-08-07 NOTE — Discharge Summary (Signed)
[place]. [facility]  Patient:    JOZEE, HAMMER Visit Number: [id number] MRN: [id number]          Service Type: MED Location: 3700 3711 01 Attending Physician:  Rollene Rotunda Dictated by:   Joellyn Rued, P.A.-C. Admit Date:  12/27/2000 Discharge Date: 12/29/2000   CC:         Rollene Rotunda, M.D. [facility]   Referring Physician Discharge [place]  DATE OF BIRTH:  10/31/1970  SUMMARY OF HISTORY:  Mr. Mcquerry is a 40 year old [place] female who presents to the emergency room with a five-day history of chest discomfort.  She first noticed the discomfort while watching TV on Thursday afternoon and described it as a hot burning sensation.  She gave it a 1-2 on a scale of 0-10.  She states that she attributed this discomfort to heartburn, but she did not take any medication.  Her only associated symptoms were some "mild shortness of breath."  The discomfort continued, but increased in intensity on the day of presentation and she was awaken around 3:30 a.m.  She felt that the discomfort became a 10 on a scale of 0-10, waking her from sleep.  At that time, she complained of shortness of breath, nausea, vomiting x 5, lightheadedness, dizziness, facial numbing, and some radiation into both of her arms.  She was also diaphoretic at this time and felt that she could barely walk to her neighbors house to get a ride to the emergency room.  On presentation, she gave the pain a 5-6 on a scale of 0-10 despite appearing very comfortable. Her history is notable for anxiety and has been treated in the mental health clinic.  It is also noted that she stated that she did cocaine on the Saturday prior to her admission.  She smokes four to five joints per day and several cigarettes per day.  LABORATORY DATA:  The admission urinalysis was positive for a small amount of bilirubin, trace leukocyte esterase, and a few epithelials.  The drug screen was  positive for cocaine.  The admission CK was 79 with an MB of 6.9 and a troponin of 0.08.  The second CK rose to 1928 with an MB of 398.8, relative index 20.7, and troponin 36.73.  The third CK was elevated at 2059 with an MB of 390.0, relative index 18.9, and troponin 36.2.  Subsequent enzymes were declining.  The admission sodium was 141, potassium 3.6, BUN 13, and creatinine 0.7.  The AST was 39 and her ALT was 63.  The AST and ALT increased to 167 and 66, respectively, on December 27, 2000.  The admission PT was 13.4 and the PTT was 81.  The D-dimer was 4.35.  The admission H&H was 15.7 and 46.5, normal indices, platelets 192, and WBC 7.3.  The chest x-ray did not show any active disease.  The EKG on admission showed normal sinus rhythm and nonspecific EKG changes.  Her PR interval was slightly short at 100 msec and early R.  Subsequent EKGs did not show any changes.  HOSPITAL COURSE:  Ms. Cendejas was admitted to the unit 4700 with atypical chest discomfort.  She was placed on aspirin.  It was felt that she may have possibly had spasm secondary to her recent cocaine use.  That evening her enzymes were coming back as positive, thus she was placed on heparin, Norvasc, and Imdur and her Cardiolite was canceled.  Luis Abed, M.D., notes that in the  setting of cocaine it seemed most compatible with coronary occlusion at 3:30 a.m. today followed by rapid spontaneous reperfusion.  Cardiac catheterization was performed on December 28, 2000, by Everardo Beals. Juanda Chance, M.D. Her right coronary artery had some irregularities.  She had a proximal 40% ramus lesion and a 90% OM lesion.  Angioplasty stenting was performed to the OM lesion, reducing this from 90% to less than 15%.  She had some distal anterior akinesis with an EF of 55%.  Post sheath removal and bed rest, she was ambulating without difficulty.  It was felt that on December 29, 2000, after reviewing by Rollene Rotunda, M.D., that she could be  discharged. However, at the time of discharge instructions, it was found out that the patient does not have any financial ability to obtain medications and also states that she has been chewing all of her medications throughout her admission, including the Imdur, because she states that she cannot swallow pills and she relates an extreme amount of anxiety.  This was relayed to Rollene Rotunda, M.D., via the phone.  Dr. Antoine Poche recommended for her to remain in the hospital with a psychiatric evaluation to treat her anxiety and to readjust her medications in regards to the previously mentioned factors. However, the patient refused further evaluation.  Thus, she was discharged.  DISCHARGE DIAGNOSES: 1. Lateral wall myocardial infarction, probably ruptured plaque secondary to    cocaine use, status post angioplasty stenting to her obtuse marginal    branch. 2. Anxiety. 3. Substance abuse.  DISPOSITION:  She is discharged home.  DISCHARGE MEDICATIONS:  She was given recommendations to continue aspirin 325 mg q.d., Norvasc 5 mg q.d., and Plavix 75 mg q.d. for four weeks which are all crushable tablets.  Her Imdur was changed to Isordil 10 mg t.i.d. so she could crush them as well.  She was also given a prescription for sublingual nitroglycerin with instructions on how to use.  Rollene Rotunda, M.D., wishes her to be on SBE prophylaxis 500 mg four tablets one hour before any GI or dental procedures.  ACTIVITY:  She was asked not to lift, drive, sexual activity, or heavy exertion until seen by the physician.  DIET:  Maintain a low-salt, low-fat, low-cholesterol diet.  WOUND CARE:  If she had any problems with her catheterization site, she was asked to call our office.  SPECIAL INSTRUCTIONS:  She was told no smoking and absolutely no drug use. She was given instructions that she may stop by the office on the way home as that we do have samples of the Norvasc in our office.   FOLLOW-UP:   She needs to obtain a family doctor, as well as arrange appointments with mental health.  In approximately eight weeks, she will need fasting cholesterol and liver panel.  She has an appointment to see Rollene Rotunda, M.D., on January 10, 2001, at 10 a.m. at our [place], [place], office. Dictated by:   Joellyn Rued, P.A.-C. Attending Physician:  Rollene Rotunda DD:  12/29/00 TD:  12/29/00 Job: [id number]

## 2010-08-07 NOTE — Cardiovascular Report (Signed)
Christine Davidson, Christine Davidson                ACCOUNT NO.:  [id number]   MEDICAL RECORD NO.:  [id number]          PATIENT TYPE:  INP   LOCATION:  3743                         FACILITY:  [facility]   PHYSICIAN:  Noralyn Pick. Eden Emms, MD, FACCDATE OF BIRTH:  [birth year]   DATE OF PROCEDURE:  DATE OF DISCHARGE:  01/24/2006                              CARDIAC CATHETERIZATION   [name] is a 40 year old patient of Dr. Regino Schultze.  She has had  intervention by Dr. Juanda Chance and Dr. Samule Ohm.  She has had a stent in the circ  and right coronary artery.  She has been noncompliant for 2 years, not  taking any medications.  She has been smoking both cigarettes and the  cannibus and doing cocaine from time to time.  She was admitted to the  hospital with chest pain.   She had catheterization done from right femoral artery.  A Noto catheter was  used to engage the right coronary artery.  A standard JL4 catheter was used  to engage the left main.   Left main coronary artery is normal.   Left anterior descending artery had 20-30% multiple discrete lesions in the  mid vessel.  The remainder of the artery was normal.   Circumflex coronary artery was nondominant.  It was normal.  There was a  large stent and a large first obtuse marginal branch which was normal.   The right coronary artery was dominant.  There appeared to be overlapping  stents in the proximal portion which were widely patent.  The mid and distal  vessels were normal.   Right ventriculography showed inferior basilar hypokinesis.  The EF was 55%.  There was no gradient across the aortic valve and no MR.  Aorta pressure is  120/68.  LV pressure is 120/18.   At the end of the case, the patient was Angio-Sealed with good hemostasis.   IMPRESSION:  The patient would appear to have noncardiac chest pain.  She  had a CT to rule out pulmonary emboli in the ER.  Apparently, she does have  a pulmonary nodule that will need follow up in the lingula.  She will  follow  up with Dr. Juanda Chance and have a follow up CT in 3 months.           ______________________________  Noralyn Pick. Eden Emms, MD, [facility]     PCN/MEDQ  D:  01/24/2006  T:  01/24/2006  Job:  [id number]   cc:   Everardo Beals. Juanda Chance, MD, [facility]

## 2010-08-07 NOTE — Cardiovascular Report (Signed)
[place]. [facility]  Patient:    Christine Davidson, Christine Davidson Visit Number: [id number] MRN: [id number]          Service Type: MED Location: 3700 3711 [id number] Attending Physician:  Rollene Rotunda Proc. Date: 12/28/00 Admit Date:  12/27/2000   CC:         Luis Abed, M.D. [facility]   Cardiac Catheterization  INDICATIONS:  Christine Davidson is 40 years old and has no prior history of known heart disease.  She presented to the hospital with chest pain and a normal ECG, but her enzymes returned markedly positive.  She had a history of cocaine abuse recently.  She was scheduled for catheterization today.  DESCRIPTION OF PROCEDURE:  The procedure was performed via the right femoral artery using arterial sheath and 6 French preformed coronary catheters.  A front wall arterial puncture was performed and Omnipaque contrast was used. After completion of the diagnostic study, we made the decision to proceed with intervention of the circumflex artery.  The patient was given weight-adjusted heparin and following an ACT greater than 200 seconds and was given double bolus Integrilin and infusion.  We used a 6 [place] Voda 3.5 guiding catheter and a short floppy wire.  We crossed the lesion in the circumflex marginal branch with the wire without difficulty.  We direct stented with a 3.5 x 18 mm Zeta stent deploying this with one inflation of 14 atmospheres for 35 seconds.  This left an eccentric narrowing in the stent in the LAO view.  So we went back in with a 3.75 x 12 mm Powersail and performed one inflation of 18 atmospheres for 35 seconds.  Repeat diagonal studies were then performed through the guiding catheter.  We did not completely get rid of the waste on the balloon even at 18 atmospheres.  RESULTS:  The left main coronary artery was free of significant disease.  The left anterior descending artery gave rise to three septal perforators and two diagonal branches.  There was Timi 2  flow in the LAD, but there was no major obstruction.  The circumflex artery gave rise to a large intermediate branch, marginal branch, and a posterolateral branch.  There was 40% narrowing in the intermediate branch.  There was 90% stenosis in the midportion of the marginal branch.  The right coronary artery was a moderate size vessel that gave rise to a clonus branch, right ventricular branch, a posterior descending branch, and two posterolateral branches.  There was Timi 2 flow in the distal vessel, but no major obstruction.  The left ventriculogram performed in the RAO projection showed good wall motion with no areas of hypokinesis.  The estimated ejection fraction was 55%.  The left ventriculogram performed in the LAO projection showed akinesis of the posterior wall.  Following stenting of the circumflex marginal vessel the stenosis improved from 90% to less than 15%.  CONCLUSION: 1. Coronary artery disease status post recent non-Q wave myocardial infarction    associated with cocaine abuse with 40% narrowing in the intermediate branch    of the circumflex artery, 90% narrowing in the marginal branch of the    circumflex artery, irregularities in the right coronary artery, and    posterior wall akinesis. 2. Successful stenting of the circumflex marginal vessel with improvement in    percent diametere narrowing form 90% to less than 15%.  DISPOSITION:  The patient was taken to the postanesthesia recovery room for further observation. Attending Physician:  Rollene Rotunda DD:  12/28/00 TD:  12/28/00 Job: [id number]

## 2010-08-26 ENCOUNTER — Other Ambulatory Visit (HOSPITAL_COMMUNITY): Payer: Self-pay | Admitting: Dentistry

## 2010-08-26 DIAGNOSIS — K0401 Reversible pulpitis: Secondary | ICD-10-CM

## 2010-08-26 DIAGNOSIS — K045 Chronic apical periodontitis: Secondary | ICD-10-CM

## 2010-08-26 DIAGNOSIS — K0530 Chronic periodontitis, unspecified: Secondary | ICD-10-CM

## 2010-08-26 DIAGNOSIS — K083 Retained dental root: Secondary | ICD-10-CM

## 2010-08-31 IMAGING — CR DG WRIST COMPLETE 3+V*L*
3 series · 3 of 3 positions shown · non-contrast
Comparison: 09/04/2008

CLINICAL DATA: ORIF scaphoid bone graft.

LEFT WRIST - COMPLETE 3+ VIEW

[view not recorded (1 of 3)]
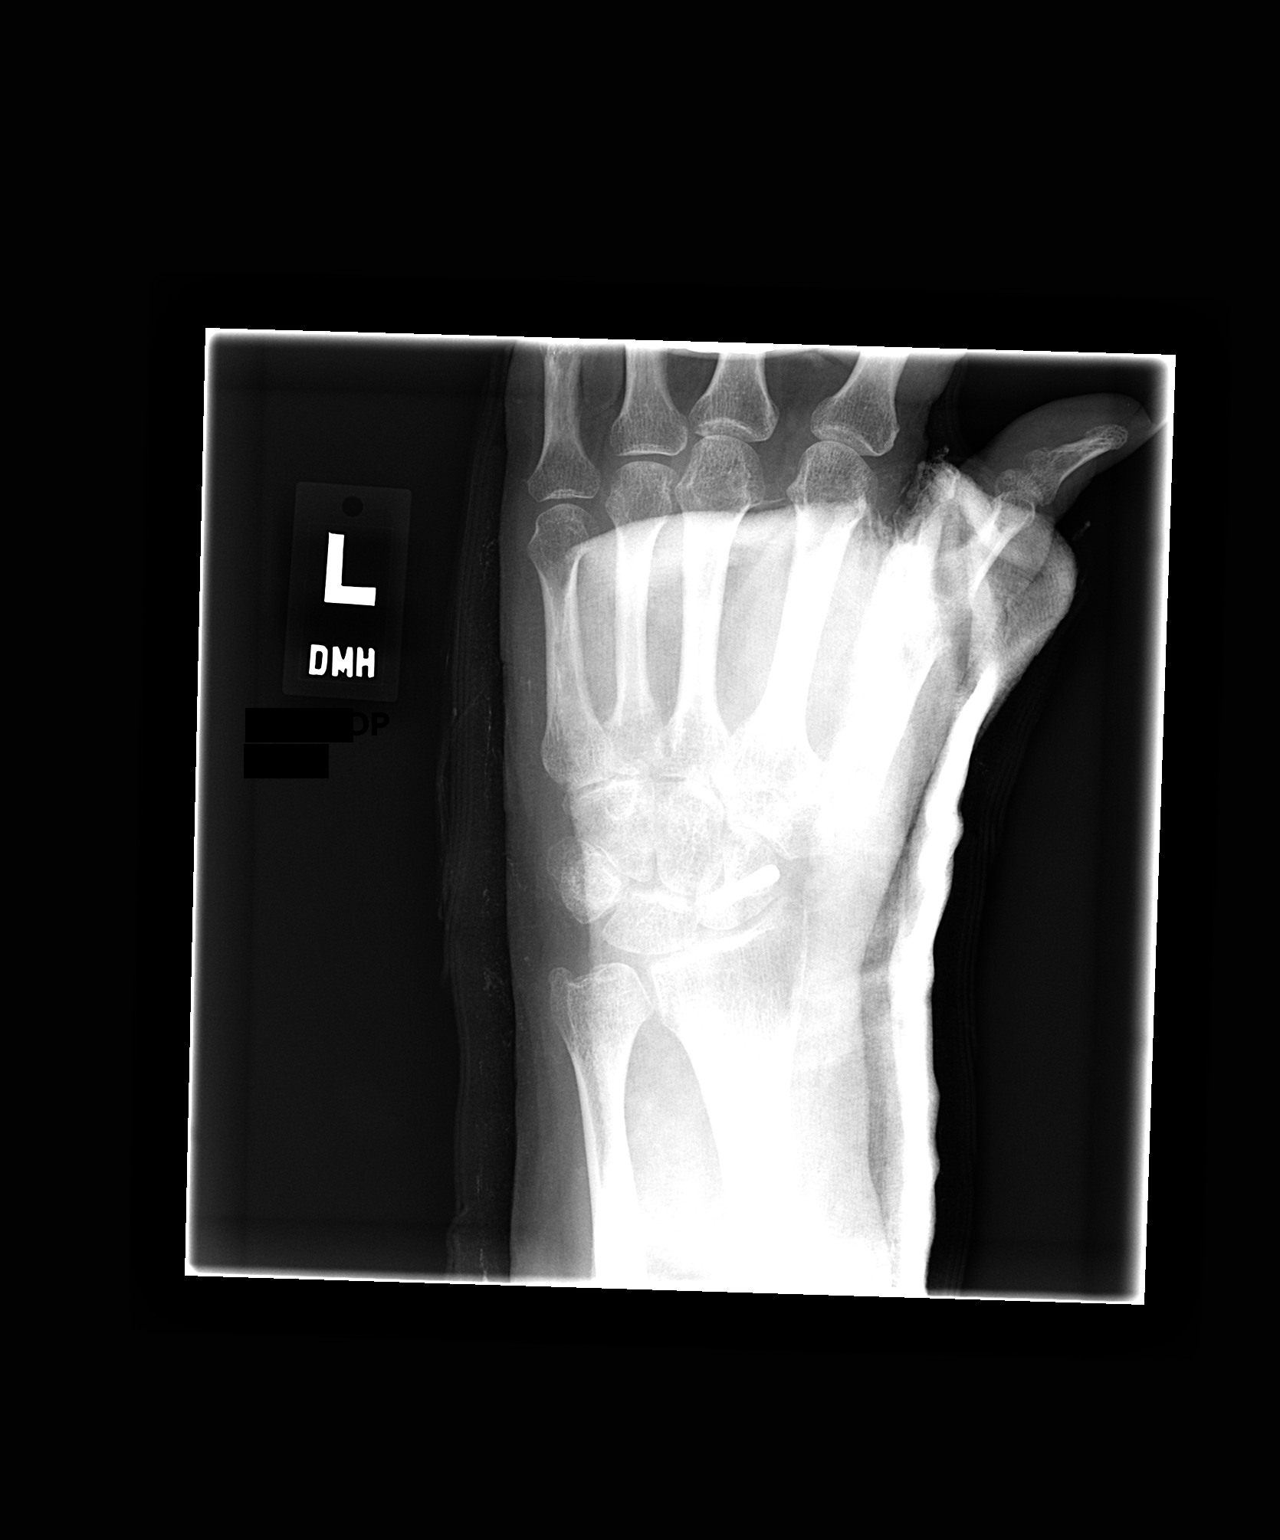

[view not recorded (2 of 3)]
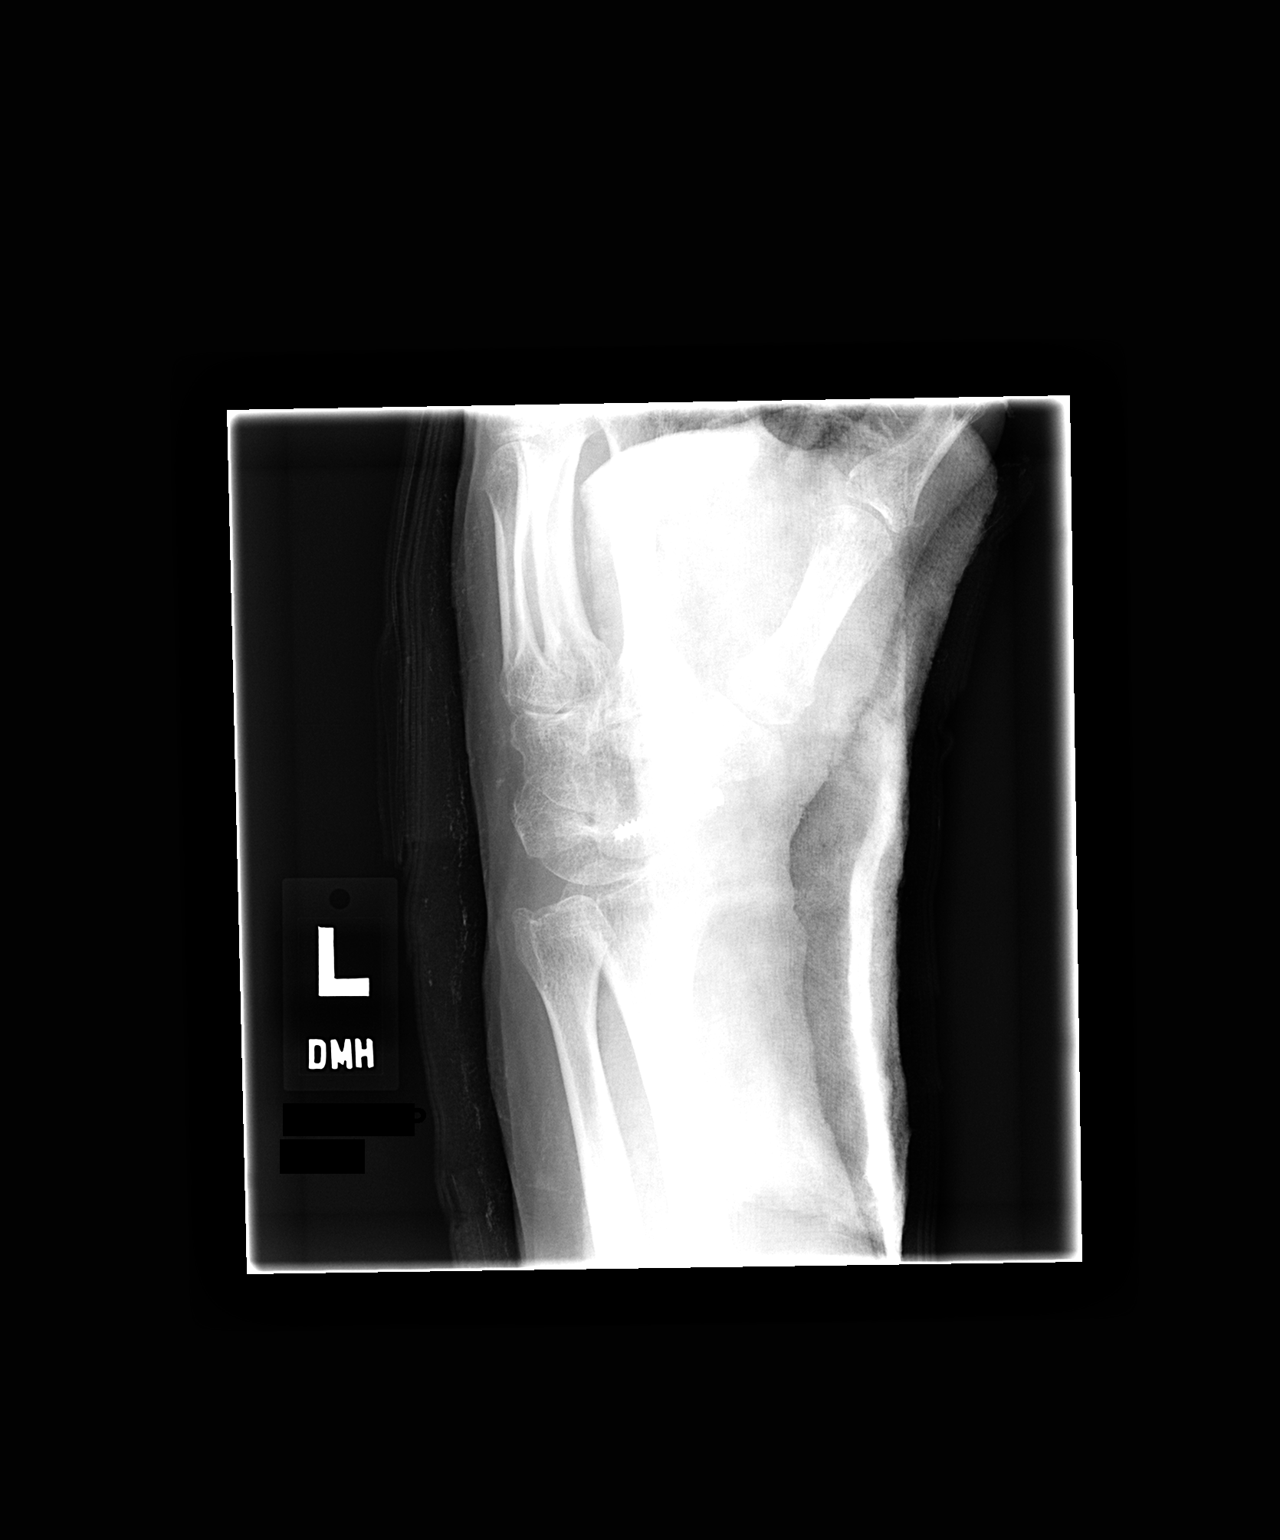

[view not recorded (3 of 3)]
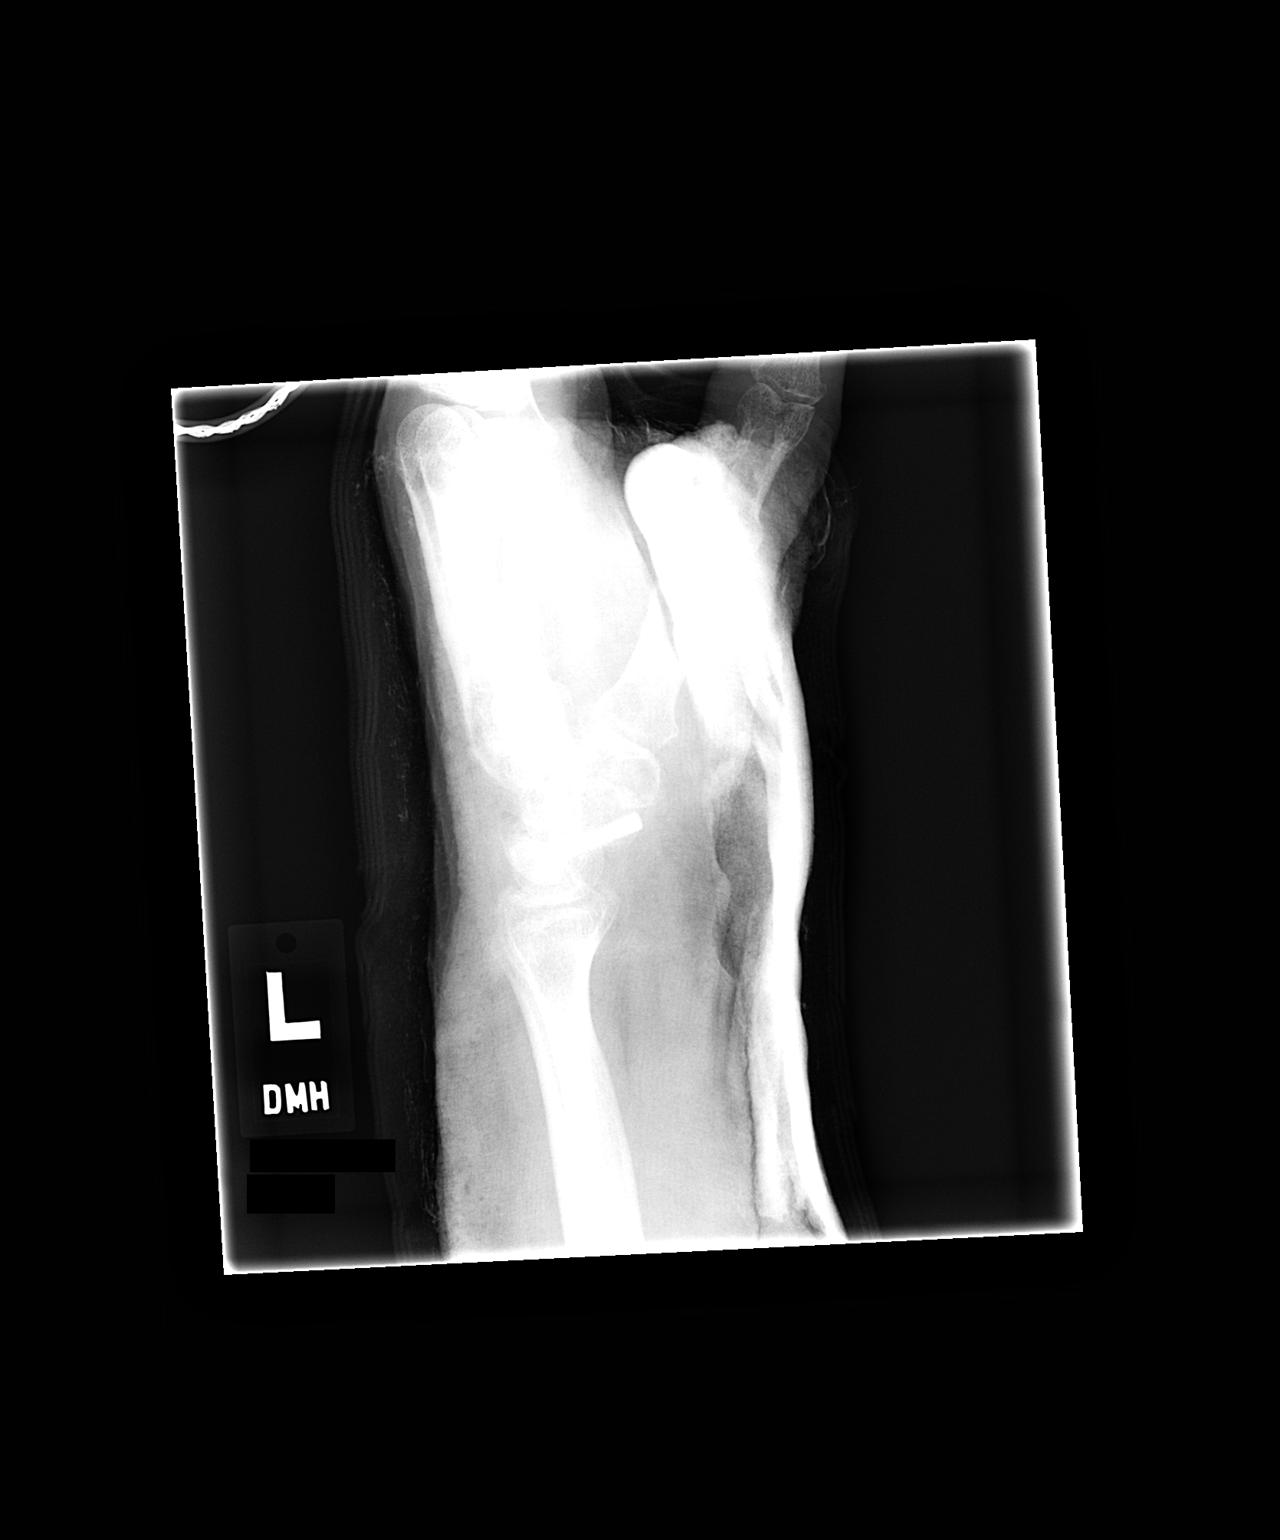

[3 of 3 positions shown; findings below may reference images not displayed]

FINDINGS: In cast views of the left wrist demonstrates a screw
within the left scaphoid.  Near anatomic alignment noted.  No
complicating features.
IMPRESSION: ORIF left scaphoid.

## 2010-09-24 ENCOUNTER — Other Ambulatory Visit (HOSPITAL_COMMUNITY): Payer: Self-pay | Admitting: Dentistry

## 2010-09-24 ENCOUNTER — Encounter (HOSPITAL_COMMUNITY): Payer: Medicaid Other

## 2010-09-24 LAB — CBC
MCHC: 33.3 g/dL (ref 30.0–36.0)
MCV: 94.4 fL (ref 78.0–100.0)
WBC: 9.7 10*3/uL (ref 4.0–10.5)

## 2010-09-24 LAB — COMPREHENSIVE METABOLIC PANEL
AST: 30 U/L (ref 0–37)
CO2: 26 mEq/L (ref 19–32)
Chloride: 104 mEq/L (ref 96–112)
Glucose, Bld: 116 mg/dL — ABNORMAL HIGH (ref 70–99)
Sodium: 139 mEq/L (ref 135–145)
Total Bilirubin: 0.4 mg/dL (ref 0.3–1.2)
Total Protein: 7.7 g/dL (ref 6.0–8.3)

## 2010-09-24 LAB — HCG, SERUM, QUALITATIVE: Preg, Serum: NEGATIVE

## 2010-09-25 ENCOUNTER — Ambulatory Visit (HOSPITAL_COMMUNITY)
Admission: RE | Admit: 2010-09-25 | Discharge: 2010-09-25 | Disposition: A | Discharge status: Home / Self Care | Payer: Medicaid Other | Source: Ambulatory Visit | Attending: Dentistry | Admitting: Dentistry

## 2010-09-25 DIAGNOSIS — F209 Schizophrenia, unspecified: Secondary | ICD-10-CM | POA: Insufficient documentation

## 2010-09-25 DIAGNOSIS — J4489 Other specified chronic obstructive pulmonary disease: Secondary | ICD-10-CM | POA: Insufficient documentation

## 2010-09-25 DIAGNOSIS — K0401 Reversible pulpitis: Secondary | ICD-10-CM | POA: Insufficient documentation

## 2010-09-25 DIAGNOSIS — F319 Bipolar disorder, unspecified: Secondary | ICD-10-CM | POA: Insufficient documentation

## 2010-09-25 DIAGNOSIS — K045 Chronic apical periodontitis: Secondary | ICD-10-CM

## 2010-09-25 DIAGNOSIS — J449 Chronic obstructive pulmonary disease, unspecified: Secondary | ICD-10-CM | POA: Insufficient documentation

## 2010-09-25 DIAGNOSIS — Z7982 Long term (current) use of aspirin: Secondary | ICD-10-CM | POA: Insufficient documentation

## 2010-09-25 DIAGNOSIS — M278 Other specified diseases of jaws: Secondary | ICD-10-CM | POA: Insufficient documentation

## 2010-09-25 DIAGNOSIS — F17200 Nicotine dependence, unspecified, uncomplicated: Secondary | ICD-10-CM | POA: Insufficient documentation

## 2010-09-25 DIAGNOSIS — Z79899 Other long term (current) drug therapy: Secondary | ICD-10-CM | POA: Insufficient documentation

## 2010-09-25 DIAGNOSIS — K0530 Chronic periodontitis, unspecified: Secondary | ICD-10-CM

## 2010-09-25 DIAGNOSIS — I2510 Atherosclerotic heart disease of native coronary artery without angina pectoris: Secondary | ICD-10-CM | POA: Insufficient documentation

## 2010-09-25 DIAGNOSIS — Q386 Other congenital malformations of mouth: Secondary | ICD-10-CM | POA: Insufficient documentation

## 2010-09-28 NOTE — Op Note (Signed)
Christine Davidson, ARGYLE                ACCOUNT NO.:  [id number]  MEDICAL RECORD NO.:  [id number]  LOCATION:  DAYL                         FACILITY:  [facility]  PHYSICIAN:  Cindra Eves, D.D.S.DATE OF BIRTH:  11-16-1970  DATE OF PROCEDURE: DATE OF DISCHARGE:                              OPERATIVE REPORT   PREOPERATIVE DIAGNOSES: 1. Coronary artery disease. 2. Acute pulpitis. 3. Apical periodontitis. 4. Chronic periodontitis. 5. Apically positioned maxillary labial frenum.  POSTOPERATIVE DIAGNOSES: 1. Coronary artery disease. 2. Acute pulpitis. 3. Apical periodontitis. 4. Chronic periodontitis. 5. Apically positioned maxillary labial frenum. 6. Maxillary left buccal exostoses.  OPERATIONS: 1. Extraction of remaining teeth (tooth numbers 4, 5, 6, 7, 8, 9, 10,     11, 12, 13, 16, 18, 19, 20, 21, 22, 23, 26, 27, 28, 29, 30 and 31). 2. Four quadrants of alveoloplasty. 3. Maxillary left lateral exostoses reductions. 4. Maxillary labial frenectomy.  SURGEON:  Cindra Eves, D.D.S.  ASSISTANT:  Public house manager (Sales executive).  ANESTHESIA:  General anesthesia via nasoendotracheal tube.  MEDICATIONS: 1. Clindamycin 600 mg IV prior to invasive dental procedures. 2. Local anesthesia with a total utilization of five carpules each     containing 34 mg of lidocaine with 0.017 mg of epinephrine as well     as two carpules each containing 9 mg of bupivacaine with 0.009 mg     of epinephrine.  SPECIMENS:  There were 23 teeth that were discarded.  DRAINS:  None.  CULTURES:  None.  COMPLICATIONS:  None.  ESTIMATED BLOOD LOSS:  100 mL.  FLUIDS:  1200 mL of lactated Ringer's solution.  INDICATIONS:  The patient was referred to dental medicine with a history of acute pulpitis symptoms and significant coronary artery disease.  A cardiac consultation was obtained and the patient was cleared for the dental procedures.  The patient had been previously evaluated and had agreed  to proceed with extraction of all remaining teeth with alveoloplasty and preprosthetic surgery as indicated.  This treatment plan was formulated to decrease the risk and complications associated with dental infection from further affecting the patient's systemic health.  OPERATIVE FINDINGS:  The patient was examined in operating room #3.  The teeth were identified for extraction.  The patient noted be affected by a history of acute pulpitis, apical periodontitis, chronic periodontitis, and an apically positioned maxillary labial frenum. During the operating procedure, significant buccal exostoses were noted involving the maxillary left posterior quadrant.  These were deemed necessary to be removed prior to prosthetic rehabilitation.  DESCRIPTION OF PROCEDURE:  The patient was brought to the main operating room #3.  The patient was then placed in supine position on the operating room table.  General anesthesia was then induced via nasoendotracheal tube.  The patient was then prepped and draped in the usual manner for dental medicine procedure.  A time-out was performed and the patient was identified, procedures were verified.  A throat pack was placed at this time.  The oral cavity was then thoroughly examined with findings noted above.  The patient was then ready for the dental medicine procedure as follows:  Local anesthesia was administered sequentially with a total utilization of five carpules each  containing 34 mg of lidocaine with 0.017 mg of epinephrine as well as two carpules each containing 9 mg of bupivacaine with 0.009 mg of epinephrine.  Maxillary left and right quadrants first approached.  Anesthesia was delivered via infiltration utilizing lidocaine with epinephrine.  This point in time, the maxillary right quadrant was approached, 15 blade incision was made from the distal of #3 and extended to the distal of #16.  A surgical flap was then carefully reflected.  The  teeth were then subluxated with a series of straight elevators.  Tooth numbers 4, 5, 6, 7, 8, 9, 10, 11, 12, and 16 were then removed with a 150 forceps without complications.  At this point in time, significant buccal exostoses were noted in the area of numbers 14-16.  The flap was then further reflected and these exostoses were then removed with rongeurs and bone file appropriately.  Further alveoloplasty was then performed utilizing rongeurs and bone file.  The tissues were approximated and trimmed appropriately.  The surgical site was then irrigated with copious amounts of sterile saline.  The maxillary right surgical site was then closed from the distal #3 and extended to the mesial #8 utilizing 3-0 chromic gut suture in a continuous interrupted suture technique x1.  The maxillary left surgical site was then closed from the distal of the maxillary left tuberosity, extended the mesial #9 utilizing 3-0 chromic gut suture in a continuous interrupted suture technique x1.  At this point in time, the apically positioned maxillary labial frenum was approached.  Anesthetic infiltration was achieved around the maxillary labial frenum.  The maxillary labial frenum was then grasped with a curved hemostat and appropriate dissection was made of the frenum utilizing a 15 blade and multiple incisions appropriately. The tissues were undermined appropriately and the surgical site was irrigated with copious amounts of sterile saline.  The surgical site was then closed in a linear fashion utilizing nine 4-0 chromic gut interrupted sutures.  At this point in time, the labial frenectomy was complete.    At this point in time, the mandibular quadrants were approached.  The patient was given bilateral inferior alveolar nerve blocks utilizing the bupivacaine with epinephrine.  Further infiltration was then achieved utilizing lidocaine with epinephrine.  At this point in time, 15 blade incision was made  from distal #32 and extended to the mesial of #22, surgical flap was then carefully reflected.  The teeth were then subluxated with a series straight elevators.  Tooth numbers 30 and 31 were then removed with a 23 forceps without complications.  The coronal aspects of tooth numbers 27, 28, and 29 were then removed with a 151 forceps.  This left the roots remaining.  Tooth numbers 23 and 26 were then removed with a 151 forceps without complications.  Further alveoloplasty was then performed utilizing rongeurs and bone file.  The retained roots in the area of tooth numbers 27, 28, and 29 were then approached and a surgical handpiece and copious amounts of sterile saline were utilized to remove the bone around the retained roots in these areas.  The roots were then subluxated and eventually removed with a 151 forceps without further complications.  Alveoplasty was then performed utilizing rongeurs and bone file.  Tissues were approximated and trimmed appropriately.  Surgical site was irrigated with copious amounts of sterile saline.  Surgical site was then close from the distal #32 and extended to the mesial #25 utilizing 3-0 chromic gut suture in a continuous interrupted suture technique x1.  At this point in time, a 15 blade incision was made from the distal #17 and extended to the mesial #22. The surgical flap was then carefully reflected.   Appropriate amounts of buccal and interseptal bone were removed around tooth numbers 20, 21, and 22 at this time.  All teeth on the lower left quadrant were then subluxated with a series straight elevators.  Tooth numbers 18 and 19 were then removed with a 23 forceps without complications.  Tooth numbers 20, 21, 22 were then removed with a 151 forceps without complications.  Alveoplasty was then performed utilizing rongeurs and bone file.  The tissues were approximated and trimmed appropriately. Surgical sites were then again irrigated with copious  amounts of sterile saline x2.  The mandibular left surgical site was then closed from the distal #17, extended to the mesial #24 utilizing 3-0 chromic gut suture in a continuous interrupted suture technique x1.  This point in time, the entire mouth was irrigated with copious amounts of sterile saline. The patient was examined for complications, seeing none, the dental medicine procedure deemed to be complete.  Throat pack was removed at this time.  A series of 4x4 gauze were placed in the mouth to aid hemostasis.  An oral airway was then placed at the request of the anesthesia team.  The patient was then handed over to the anesthesia team for final disposition.  After appropriate amount of time, the patient was extubated and taken to the post anesthesia care unit with stable vital signs, good oxygenation level.  All counts were correct for the dental medicine procedure.  The patient was given appropriate pain medication and is to utilize Percocet 5/325, 1-2 tablets every 4-6 hours as needed for pain.  The patient will be seen in approximately 7-10 days for evaluation for suture removal.  All counts were correct for dental medicine procedure.          ______________________________ Cindra Eves, D.D.S.     RK/MEDQ  D:  09/25/2010  T:  09/25/2010  Job:  [id number]  cc:   Jocelyn Lamer D. Pecola Leisure, M.D. Fax: [phone]  Electronically Signed by Cindra Eves D.D.S. on 09/28/2010 07:57:03 AM

## 2010-10-05 DIAGNOSIS — K08109 Complete loss of teeth, unspecified cause, unspecified class: Secondary | ICD-10-CM

## 2010-10-21 ENCOUNTER — Inpatient Hospital Stay (INDEPENDENT_AMBULATORY_CARE_PROVIDER_SITE_OTHER)
Admission: RE | Admit: 2010-10-21 | Discharge: 2010-10-21 | Disposition: A | Discharge status: Home / Self Care | Payer: Medicaid Other | Source: Ambulatory Visit | Attending: Family Medicine | Admitting: Family Medicine

## 2010-10-21 DIAGNOSIS — K089 Disorder of teeth and supporting structures, unspecified: Secondary | ICD-10-CM

## 2010-10-26 DIAGNOSIS — K08109 Complete loss of teeth, unspecified cause, unspecified class: Secondary | ICD-10-CM

## 2010-11-09 DIAGNOSIS — K08109 Complete loss of teeth, unspecified cause, unspecified class: Secondary | ICD-10-CM

## 2010-11-17 DIAGNOSIS — K08109 Complete loss of teeth, unspecified cause, unspecified class: Secondary | ICD-10-CM

## 2010-11-26 ENCOUNTER — Ambulatory Visit (HOSPITAL_COMMUNITY): Payer: Medicaid - Dental | Admitting: Dentistry

## 2010-11-26 DIAGNOSIS — K08109 Complete loss of teeth, unspecified cause, unspecified class: Secondary | ICD-10-CM

## 2010-12-02 ENCOUNTER — Emergency Department (HOSPITAL_COMMUNITY)
Admission: EM | Admit: 2010-12-02 | Discharge: 2010-12-02 | Disposition: A | Discharge status: Home / Self Care | Payer: Medicaid Other | Attending: Emergency Medicine | Admitting: Emergency Medicine

## 2010-12-02 DIAGNOSIS — F319 Bipolar disorder, unspecified: Secondary | ICD-10-CM | POA: Insufficient documentation

## 2010-12-02 DIAGNOSIS — I2510 Atherosclerotic heart disease of native coronary artery without angina pectoris: Secondary | ICD-10-CM | POA: Insufficient documentation

## 2010-12-02 DIAGNOSIS — I10 Essential (primary) hypertension: Secondary | ICD-10-CM | POA: Insufficient documentation

## 2010-12-02 DIAGNOSIS — I252 Old myocardial infarction: Secondary | ICD-10-CM | POA: Insufficient documentation

## 2010-12-02 DIAGNOSIS — Z79899 Other long term (current) drug therapy: Secondary | ICD-10-CM | POA: Insufficient documentation

## 2010-12-02 DIAGNOSIS — E669 Obesity, unspecified: Secondary | ICD-10-CM | POA: Insufficient documentation

## 2010-12-02 LAB — DIFFERENTIAL
Basophils Absolute: 0 10*3/uL (ref 0.0–0.1)
Basophils Relative: 0 % (ref 0–1)
Eosinophils Absolute: 0.2 10*3/uL (ref 0.0–0.7)
Lymphs Abs: 1.8 10*3/uL (ref 0.7–4.0)
Monocytes Relative: 7 % (ref 3–12)
Neutro Abs: 5.3 10*3/uL (ref 1.7–7.7)
Neutrophils Relative %: 67 % (ref 43–77)

## 2010-12-02 LAB — CBC
HCT: 47.9 % — ABNORMAL HIGH (ref 36.0–46.0)
MCHC: 33.2 g/dL (ref 30.0–36.0)
Platelets: 201 10*3/uL (ref 150–400)
RDW: 13.3 % (ref 11.5–15.5)

## 2010-12-02 LAB — BASIC METABOLIC PANEL
BUN: 8 mg/dL (ref 6–23)
Chloride: 104 mEq/L (ref 96–112)
GFR calc Af Amer: >60 mL/min (ref >60)
GFR calc non Af Amer: >60 mL/min (ref >60)
Glucose, Bld: 111 mg/dL — ABNORMAL HIGH (ref 70–99)
Potassium: 4.4 mEq/L (ref 3.5–5.1)

## 2010-12-02 LAB — ETHANOL: Alcohol, Ethyl (B): <11 mg/dL (ref 0–11)

## 2010-12-07 ENCOUNTER — Encounter (HOSPITAL_COMMUNITY): Payer: Self-pay | Admitting: Dentistry

## 2010-12-10 ENCOUNTER — Encounter (HOSPITAL_COMMUNITY): Payer: Medicaid - Dental | Admitting: Dentistry

## 2010-12-10 DIAGNOSIS — K08109 Complete loss of teeth, unspecified cause, unspecified class: Secondary | ICD-10-CM

## 2010-12-14 ENCOUNTER — Encounter (HOSPITAL_COMMUNITY): Payer: Self-pay | Admitting: Dentistry

## 2010-12-15 DIAGNOSIS — K1370 Unspecified lesions of oral mucosa: Secondary | ICD-10-CM

## 2010-12-28 LAB — RAPID URINE DRUG SCREEN, HOSP PERFORMED
Amphetamines: NOT DETECTED
Benzodiazepines: POSITIVE — AB
Cocaine: NOT DETECTED
Tetrahydrocannabinol: NOT DETECTED

## 2010-12-28 LAB — BASIC METABOLIC PANEL
BUN: 10
Calcium: 9.1
Creatinine, Ser: 0.68
GFR calc non Af Amer: >60
Glucose, Bld: 89

## 2010-12-28 LAB — PREGNANCY, URINE: Preg Test, Ur: NEGATIVE

## 2010-12-30 ENCOUNTER — Encounter (HOSPITAL_COMMUNITY): Payer: Self-pay | Admitting: Dentistry

## 2011-11-08 ENCOUNTER — Emergency Department (HOSPITAL_COMMUNITY): Payer: Medicaid Other

## 2011-11-08 ENCOUNTER — Observation Stay (HOSPITAL_COMMUNITY)
Admission: EM | Admit: 2011-11-08 | Discharge: 2011-11-09 | Disposition: A | Discharge status: Home / Self Care | Payer: Medicaid Other | Attending: Internal Medicine | Admitting: Internal Medicine

## 2011-11-08 ENCOUNTER — Other Ambulatory Visit: Payer: Self-pay

## 2011-11-08 ENCOUNTER — Encounter (HOSPITAL_COMMUNITY): Payer: Self-pay | Admitting: *Deleted

## 2011-11-08 DIAGNOSIS — I10 Essential (primary) hypertension: Secondary | ICD-10-CM | POA: Insufficient documentation

## 2011-11-08 DIAGNOSIS — F17200 Nicotine dependence, unspecified, uncomplicated: Secondary | ICD-10-CM | POA: Insufficient documentation

## 2011-11-08 DIAGNOSIS — R0602 Shortness of breath: Secondary | ICD-10-CM | POA: Insufficient documentation

## 2011-11-08 DIAGNOSIS — F410 Panic disorder [episodic paroxysmal anxiety] without agoraphobia: Secondary | ICD-10-CM | POA: Diagnosis present

## 2011-11-08 DIAGNOSIS — F419 Anxiety disorder, unspecified: Secondary | ICD-10-CM | POA: Diagnosis present

## 2011-11-08 DIAGNOSIS — R079 Chest pain, unspecified: Principal | ICD-10-CM | POA: Insufficient documentation

## 2011-11-08 DIAGNOSIS — F319 Bipolar disorder, unspecified: Secondary | ICD-10-CM | POA: Diagnosis present

## 2011-11-08 DIAGNOSIS — E669 Obesity, unspecified: Secondary | ICD-10-CM | POA: Insufficient documentation

## 2011-11-08 DIAGNOSIS — I2510 Atherosclerotic heart disease of native coronary artery without angina pectoris: Secondary | ICD-10-CM | POA: Insufficient documentation

## 2011-11-08 DIAGNOSIS — Z6841 Body Mass Index (BMI) 40.0 and over, adult: Secondary | ICD-10-CM | POA: Insufficient documentation

## 2011-11-08 DIAGNOSIS — F411 Generalized anxiety disorder: Secondary | ICD-10-CM | POA: Insufficient documentation

## 2011-11-08 HISTORY — DX: Bipolar disorder, unspecified: F31.9

## 2011-11-08 HISTORY — DX: Angina pectoris, unspecified: I20.9

## 2011-11-08 HISTORY — DX: Anxiety disorder, unspecified: F41.9

## 2011-11-08 HISTORY — DX: Panic disorder (episodic paroxysmal anxiety): F41.0

## 2011-11-08 HISTORY — DX: Essential (primary) hypertension: I10

## 2011-11-08 HISTORY — DX: Acute myocardial infarction, unspecified: I21.9

## 2011-11-08 LAB — BASIC METABOLIC PANEL
GFR calc Af Amer: >90 mL/min (ref >90)
GFR calc non Af Amer: 88 mL/min — ABNORMAL LOW (ref >90)
Potassium: 3.7 mEq/L (ref 3.5–5.1)
Sodium: 137 mEq/L (ref 135–145)

## 2011-11-08 LAB — HEPATIC FUNCTION PANEL
Alkaline Phosphatase: 65 U/L (ref 39–117)
Bilirubin, Direct: 0.1 mg/dL (ref 0.0–0.3)
Indirect Bilirubin: 0.3 mg/dL (ref 0.3–0.9)
Total Protein: 7.5 g/dL (ref 6.0–8.3)

## 2011-11-08 LAB — TROPONIN I: Troponin I: <0.3 ng/mL (ref <0.30)

## 2011-11-08 LAB — CBC
MCHC: 34.7 g/dL (ref 30.0–36.0)
RDW: 12.8 % (ref 11.5–15.5)

## 2011-11-08 MED ORDER — METOPROLOL TARTRATE 25 MG PO TABS
12.5000 mg | ORAL_TABLET | Freq: Two times a day (BID) | ORAL | Status: DC
  Administered 2011-11-09: 12.5 mg via ORAL
  Filled 2011-11-08 (×2): qty 1

## 2011-11-08 MED ORDER — LISINOPRIL 10 MG PO TABS
20.0000 mg | ORAL_TABLET | Freq: Every day | ORAL | Status: DC
  Filled 2011-11-08: qty 2

## 2011-11-08 MED ORDER — ASPIRIN EC 81 MG PO TBEC: 81.0000 mg | DELAYED_RELEASE_TABLET | Freq: Every day | ORAL | Status: DC

## 2011-11-08 MED ORDER — ACETAMINOPHEN 325 MG PO TABS: 650.0000 mg | ORAL_TABLET | ORAL | Status: DC | PRN

## 2011-11-08 MED ORDER — ONDANSETRON HCL 4 MG/2ML IJ SOLN
INTRAMUSCULAR | Status: AC
  Administered 2011-11-08: 4 mg
  Filled 2011-11-08: qty 2

## 2011-11-08 MED ORDER — HYDROMORPHONE HCL PF 1 MG/ML IJ SOLN
1.0000 mg | Freq: Once | INTRAMUSCULAR | Status: AC
  Administered 2011-11-08: 1 mg via INTRAVENOUS
  Filled 2011-11-08: qty 1

## 2011-11-08 MED ORDER — SODIUM CHLORIDE 0.9 % IV SOLN: 250.0000 mL | INTRAVENOUS | Status: DC | PRN

## 2011-11-08 MED ORDER — ONDANSETRON HCL 4 MG/2ML IJ SOLN: 4.0000 mg | Freq: Four times a day (QID) | INTRAMUSCULAR | Status: DC | PRN

## 2011-11-08 MED ORDER — BIOTENE DRY MOUTH MT LIQD
15.0000 mL | Freq: Two times a day (BID) | OROMUCOSAL | Status: DC
  Administered 2011-11-09: 15 mL via OROMUCOSAL

## 2011-11-08 MED ORDER — PNEUMOCOCCAL VAC POLYVALENT 25 MCG/0.5ML IJ INJ
0.5000 mL | INJECTION | INTRAMUSCULAR | Status: AC
  Administered 2011-11-09: 0.5 mL via INTRAMUSCULAR
  Filled 2011-11-08: qty 0.5

## 2011-11-08 MED ORDER — ASPIRIN 300 MG RE SUPP
300.0000 mg | RECTAL | Status: AC
  Filled 2011-11-08: qty 1

## 2011-11-08 MED ORDER — ASPIRIN 325 MG PO TABS
325.0000 mg | ORAL_TABLET | Freq: Every day | ORAL | Status: DC
  Administered 2011-11-09: 325 mg via ORAL
  Filled 2011-11-08: qty 1

## 2011-11-08 MED ORDER — NITROGLYCERIN 0.4 MG SL SUBL: 0.4000 mg | SUBLINGUAL_TABLET | SUBLINGUAL | Status: DC | PRN

## 2011-11-08 MED ORDER — LAMOTRIGINE 100 MG PO TABS
100.0000 mg | ORAL_TABLET | Freq: Every day | ORAL | Status: DC
  Administered 2011-11-09: 100 mg via ORAL
  Filled 2011-11-08 (×2): qty 1

## 2011-11-08 MED ORDER — NITROGLYCERIN 2 % TD OINT
1.0000 [in_us] | TOPICAL_OINTMENT | Freq: Once | TRANSDERMAL | Status: AC
  Administered 2011-11-08: 1 [in_us] via TOPICAL
  Filled 2011-11-08: qty 1

## 2011-11-08 MED ORDER — NITROGLYCERIN 2 % TD OINT
0.5000 [in_us] | TOPICAL_OINTMENT | Freq: Three times a day (TID) | TRANSDERMAL | Status: DC
  Administered 2011-11-09: 0.5 [in_us] via TOPICAL
  Filled 2011-11-08: qty 1

## 2011-11-08 MED ORDER — SODIUM CHLORIDE 0.9 % IJ SOLN: 3.0000 mL | Freq: Two times a day (BID) | INTRAMUSCULAR | Status: DC

## 2011-11-08 MED ORDER — ASPIRIN 81 MG PO CHEW
324.0000 mg | CHEWABLE_TABLET | ORAL | Status: AC
  Administered 2011-11-09: 324 mg via ORAL
  Filled 2011-11-08: qty 3

## 2011-11-08 MED ORDER — SODIUM CHLORIDE 0.9 % IV SOLN
Freq: Once | INTRAVENOUS | Status: AC
  Administered 2011-11-08: 21:00:00 via INTRAVENOUS

## 2011-11-08 MED ORDER — QUETIAPINE FUMARATE 100 MG PO TABS
300.0000 mg | ORAL_TABLET | Freq: Every day | ORAL | Status: DC
  Filled 2011-11-08: qty 3

## 2011-11-08 MED ORDER — LORAZEPAM 2 MG/ML IJ SOLN
1.0000 mg | Freq: Once | INTRAMUSCULAR | Status: AC
  Administered 2011-11-08: 1 mg via INTRAVENOUS
  Filled 2011-11-08: qty 1

## 2011-11-08 MED ORDER — SODIUM CHLORIDE 0.9 % IV SOLN: INTRAVENOUS | Status: DC

## 2011-11-08 MED ORDER — SODIUM CHLORIDE 0.9 % IJ SOLN: 3.0000 mL | INTRAMUSCULAR | Status: DC | PRN

## 2011-11-08 MED ORDER — MIRTAZAPINE 30 MG PO TABS
45.0000 mg | ORAL_TABLET | Freq: Every day | ORAL | Status: DC
  Administered 2011-11-09: 45 mg via ORAL
  Filled 2011-11-08: qty 2

## 2011-11-08 NOTE — ED Provider Notes (Cosign Needed)
History     CSN: [id number]  Arrival date & time 11/08/11  2001   First MD Initiated Contact with Patient 11/08/11 2012      Chief Complaint  Patient presents with  . Chest Pain    (Consider location/radiation/quality/duration/timing/severity/associated sxs/prior treatment) Patient is a 41 y.o. female presenting with chest pain. The history is provided by the patient (pt complains of chest pain with sob). No language interpreter was used.  Chest Pain The chest pain began 1 - 2 hours ago. Chest pain occurs constantly. The chest pain is resolved. Associated with: sob. At its most intense, the pain is at 4/10. The pain is currently at 3/10. The severity of the pain is moderate. The quality of the pain is described as aching. The pain radiates to the left arm. Chest pain is worsened by stress. Pertinent negatives for primary symptoms include no fever, no fatigue, no cough and no abdominal pain.  Pertinent negatives for past medical history include no seizures.     Past Medical History  Diagnosis Date  . Anxiety   . Cardiac angina   . Hypertension   . Panic attack   . Bipolar 1 disorder     Past Surgical History  Procedure Date  . Cardiac catheterization     History reviewed. No pertinent family history.  History  Substance Use Topics  . Smoking status: Current Everyday Smoker -- 1.0 packs/day  . Smokeless tobacco: Not on file  . Alcohol Use: Yes    OB History    Grav Para Term Preterm Abortions TAB SAB Ect Mult Living                  Review of Systems  Constitutional: Negative for fever and fatigue.  HENT: Negative for congestion, sinus pressure and ear discharge.   Eyes: Negative for discharge.  Respiratory: Negative for cough.   Cardiovascular: Positive for chest pain.  Gastrointestinal: Negative for abdominal pain and diarrhea.  Genitourinary: Negative for frequency and hematuria.  Musculoskeletal: Negative for back pain.  Skin: Negative for rash.    Neurological: Negative for seizures and headaches.  Hematological: Negative.   Psychiatric/Behavioral: Negative for hallucinations.    Allergies  Hydrocodone; Morphine and related; Penicillins; and Tylenol  Home Medications   Current Outpatient Rx  Name Route Sig Dispense Refill  . ALPRAZOLAM ER 2 MG PO TB24 Oral Take 2 mg by mouth 2 (two) times daily.    Marland Kitchen ALPRAZOLAM 2 MG PO TABS Oral Take 2 mg by mouth daily as needed. For breakthrough anxiety    . ASPIRIN 325 MG PO TABS Oral Take 325 mg by mouth every morning.    Marlin Canary HEADACHE PO Oral Take 1 packet by mouth as needed. For pain    . LAMOTRIGINE 25 MG PO TABS Oral Take 100 mg by mouth at bedtime.    Marland Kitchen LISINOPRIL 20 MG PO TABS Oral Take 20 mg by mouth every morning.    Marland Kitchen MIRTAZAPINE 45 MG PO TABS Oral Take 45 mg by mouth at bedtime.    Marland Kitchen QUETIAPINE FUMARATE 300 MG PO TABS Oral Take 300 mg by mouth at bedtime.      BP 131/81  Pulse 103  Temp 98.7 F (37.1 C) (Oral)  Resp 24  Ht 5\' 3"  (1.6 m)  Wt 230 lb (104.327 kg)  BMI 40.74 kg/m2  SpO2 97%  LMP 10/25/2011  Physical Exam  Constitutional: She is oriented to person, place, and time. She appears well-developed.  HENT:  Head: Normocephalic and atraumatic.  Eyes: Conjunctivae and EOM are normal. No scleral icterus.  Neck: Neck supple. No thyromegaly present.  Cardiovascular: Normal rate and regular rhythm.  Exam reveals no gallop and no friction rub.   No murmur heard. Pulmonary/Chest: No stridor. She has no wheezes. She has no rales. She exhibits tenderness.  Abdominal: She exhibits no distension. There is no tenderness. There is no rebound.  Musculoskeletal: Normal range of motion. She exhibits no edema.  Lymphadenopathy:    She has no cervical adenopathy.  Neurological: She is oriented to person, place, and time. Coordination normal.  Skin: No rash noted. No erythema.  Psychiatric: She has a normal mood and affect. Her behavior is normal.    ED Course   Procedures (including critical care time)  Labs Reviewed  CBC - Abnormal; Notable for the following:    WBC 10.8 (*)     RBC 5.25 (*)     Hemoglobin 17.2 (*)     HCT 49.5 (*)     All other components within normal limits  BASIC METABOLIC PANEL - Abnormal; Notable for the following:    Glucose, Bld 111 (*)     GFR calc non Af Amer 88 (*)     All other components within normal limits  HEPATIC FUNCTION PANEL - Abnormal; Notable for the following:    ALT 53 (*)     All other components within normal limits  PRO B NATRIURETIC PEPTIDE  TROPONIN I   Chest Portable 1 View  11/08/2011  *RADIOLOGY REPORT*  Clinical Data: Hypertension, chest pain  PORTABLE CHEST - 1 VIEW  Comparison: 06/10/2010  Findings: Lungs clear.  Heart size and pulmonary vascularity normal.  No effusion.  Visualized bones unremarkable.  IMPRESSION: No acute disease   Original Report Authenticated By: Thora Lance III, M.D.      1. Chest pain      Date: 11/08/2011  Rate: 102  Rhythm: sinus tachycardia  QRS Axis: normal  Intervals: normal  ST/T Wave abnormalities: nonspecific ST changes  Conduction Disutrbances:none  Narrative Interpretation:   Old EKG Reviewed: none available    MDM          Benny Lennert, MD 11/08/11 2147

## 2011-11-08 NOTE — H&P (Signed)
Chief Complaint:  cp  HPI: 41 yo female h/o cad s/p stents x 2 first one at age 68 yr of age comes in with sscp without radiation with associated n/v and sob.  Pt ran out of her ntg pills several months.  Usually has anginal type symptoms at least 2 x a month which is relieved with ntg.  Lately she has just been using asa which relieves the pain however today she took asa and the pain worsening and then she started vomiting ems was called and gave her ntg which relieved her pain.  No le edema.  No fevers.  No cough.  Is noncompliant with meds not taking most of her meds she is suppose to be on, has not been on statin for several years due to lack of f/u with doctor to get script.  Currently cp free.  Last stress last year per patient got as preop clearance for dental surgery reports was normal.  Review of Systems:  O/w neg  Past Medical History: Past Medical History  Diagnosis Date  . Anxiety   . Cardiac angina   . Hypertension   . Panic attack   . Bipolar 1 disorder    Past Surgical History  Procedure Date  . Cardiac catheterization     Medications: Prior to Admission medications   Medication Sig Start Date End Date Taking? Authorizing Provider  ALPRAZolam (XANAX XR) 2 MG 24 hr tablet Take 2 mg by mouth 2 (two) times daily.   Yes Historical Provider, MD  alprazolam Prudy Feeler) 2 MG tablet Take 2 mg by mouth daily as needed. For breakthrough anxiety   Yes Historical Provider, MD  aspirin 325 MG tablet Take 325 mg by mouth every morning.   Yes Historical Provider, MD  Aspirin-Acetaminophen-Caffeine (GOODY HEADACHE PO) Take 1 packet by mouth as needed. For pain   Yes Historical Provider, MD  lamoTRIgine (LAMICTAL) 25 MG tablet Take 100 mg by mouth at bedtime.   Yes Historical Provider, MD  lisinopril (PRINIVIL,ZESTRIL) 20 MG tablet Take 20 mg by mouth every morning.   Yes Historical Provider, MD  mirtazapine (REMERON) 45 MG tablet Take 45 mg by mouth at bedtime.   Yes Historical Provider,  MD  QUEtiapine (SEROQUEL) 300 MG tablet Take 300 mg by mouth at bedtime.   Yes Historical Provider, MD    Allergies:   Allergies  Allergen Reactions  . Hydrocodone Hives and Swelling    REACTION: Swelling, hives around injection site. Swelling of throat  . Morphine And Related Hives and Swelling    REACTION: Swelling, hives around injection site. Swelling of throat  . Penicillins Other (See Comments)    REACTION: Childhood allergy  . Tylenol (Acetaminophen) Nausea And Vomiting and Other (See Comments)    REACTION: Upsets stomach/causes pain    Social History:  reports that she has been smoking.  She does not have any smokeless tobacco history on file. She reports that she drinks alcohol. She reports that she uses illicit drugs (Marijuana).  Family History: History reviewed. No pertinent family history.  Physical Exam: Filed Vitals:   11/08/11 2100 11/08/11 2115 11/08/11 2130 11/08/11 2145  BP: 131/81     Pulse: 103 99 95 98  Temp:      TempSrc:      Resp: 24 20 20 18   Height:      Weight:      SpO2: 97% 97% 98% 98%   General appearance: alert, cooperative and no distress Lungs: clear to auscultation bilaterally  Heart: regular rate and rhythm, S1, S2 normal, no murmur, click, rub or gallop Abdomen: soft, non-tender; bowel sounds normal; no masses,  no organomegaly Extremities: extremities normal, atraumatic, no cyanosis or edema Pulses: 2+ and symmetric Skin: Skin color, texture, turgor normal. No rashes or lesions Neurologic: Grossly normal    Labs on Admission:   [facility] 11/08/11 2014  NA 137  K 3.7  CL 102  CO2 23  GLUCOSE 111*  BUN 16  CREATININE 0.81  CALCIUM 9.9  MG --  PHOS --    Basename 11/08/11 2018  AST 32  ALT 53*  ALKPHOS 65  BILITOT 0.4  PROT 7.5  ALBUMIN 3.7    Basename 11/08/11 2014  WBC 10.8*  NEUTROABS --  HGB 17.2*  HCT 49.5*  MCV 94.3  PLT 200    Basename 11/08/11 2014  CKTOTAL --  CKMB --  CKMBINDEX --  TROPONINI  <0.30   Radiological Exams on Admission: Chest Portable 1 View  11/08/2011  *RADIOLOGY REPORT*  Clinical Data: Hypertension, chest pain  PORTABLE CHEST - 1 VIEW  Comparison: 06/10/2010  Findings: Lungs clear.  Heart size and pulmonary vascularity normal.  No effusion.  Visualized bones unremarkable.  IMPRESSION: No acute disease   Original Report Authenticated By: Osa Craver, M.D.     Assessment/Plan Present on Admission:  41 yo female with [facility]/cp .Chest pain .CAD (coronary artery disease) .Anxiety .Bipolar 1 disorder .Hypertension .Panic attack  Certainly very high risk particularly with her noncompliance.  Place on asa, bblocker, full dose love, ntg paste ck echo in am. ekg nothing acute right now.  Will need script for ntg sl and statin at d/c.  Needs to f/u with cards as outpt.   Madisun Hargrove A [phone] 11/08/2011, 10:08 PM

## 2011-11-09 DIAGNOSIS — I2510 Atherosclerotic heart disease of native coronary artery without angina pectoris: Secondary | ICD-10-CM

## 2011-11-09 DIAGNOSIS — R079 Chest pain, unspecified: Principal | ICD-10-CM

## 2011-11-09 LAB — CARDIAC PANEL(CRET KIN+CKTOT+MB+TROPI)
CK, MB: 1.7 ng/mL (ref 0.3–4.0)
CK, MB: 2.2 ng/mL (ref 0.3–4.0)
CK, MB: 2.2 ng/mL (ref 0.3–4.0)
Relative Index: INVALID (ref 0.0–2.5)
Total CK: 39 U/L (ref 7–177)
Troponin I: <0.3 ng/mL (ref <0.30)
Troponin I: <0.3 ng/mL (ref <0.30)

## 2011-11-09 LAB — BASIC METABOLIC PANEL
BUN: 14 mg/dL (ref 6–23)
CO2: 24 mEq/L (ref 19–32)
Chloride: 106 mEq/L (ref 96–112)
Creatinine, Ser: 0.69 mg/dL (ref 0.50–1.10)
Glucose, Bld: 99 mg/dL (ref 70–99)
Potassium: 4.1 mEq/L (ref 3.5–5.1)

## 2011-11-09 LAB — CBC
HCT: 47.3 % — ABNORMAL HIGH (ref 36.0–46.0)
Hemoglobin: 15.8 g/dL — ABNORMAL HIGH (ref 12.0–15.0)
MCHC: 33.4 g/dL (ref 30.0–36.0)
MCV: 95.6 fL (ref 78.0–100.0)
RDW: 13 % (ref 11.5–15.5)

## 2011-11-09 LAB — LIPID PANEL
Cholesterol: 200 mg/dL (ref 0–200)
Triglycerides: 145 mg/dL (ref <150)

## 2011-11-09 MED ORDER — ENOXAPARIN SODIUM 100 MG/ML ~~LOC~~ SOLN: 100.0000 mg | Freq: Two times a day (BID) | SUBCUTANEOUS | Status: DC

## 2011-11-09 MED ORDER — ASPIRIN 81 MG PO CHEW
CHEWABLE_TABLET | ORAL | Status: AC
  Filled 2011-11-09: qty 1

## 2011-11-09 MED ORDER — LAMOTRIGINE 25 MG PO TABS
ORAL_TABLET | ORAL | Status: AC
  Filled 2011-11-09: qty 4

## 2011-11-09 MED ORDER — ALPRAZOLAM 1 MG PO TABS
1.0000 mg | ORAL_TABLET | Freq: Four times a day (QID) | ORAL | Status: DC
  Administered 2011-11-09: 1 mg via ORAL
  Filled 2011-11-09: qty 1

## 2011-11-09 MED ORDER — NITROGLYCERIN 0.4 MG SL SUBL: 0.4000 mg | SUBLINGUAL_TABLET | SUBLINGUAL | Status: DC | PRN

## 2011-11-09 MED ORDER — ENOXAPARIN SODIUM 100 MG/ML ~~LOC~~ SOLN
100.0000 mg | SUBCUTANEOUS | Status: AC
  Administered 2011-11-09: 100 mg via SUBCUTANEOUS
  Filled 2011-11-09: qty 1

## 2011-11-09 NOTE — Progress Notes (Signed)
ANTICOAGULATION CONSULT NOTE - Initial Consult  Pharmacy Consult for Lovenox Indication: chest pain/ACS  Allergies  Allergen Reactions  . Hydrocodone Hives and Swelling    REACTION: Swelling, hives around injection site. Swelling of throat  . Morphine And Related Hives and Swelling    REACTION: Swelling, hives around injection site. Swelling of throat  . Penicillins Other (See Comments)    REACTION: Childhood allergy  . Tylenol (Acetaminophen) Nausea And Vomiting and Other (See Comments)    REACTION: Upsets stomach/causes pain    Patient Measurements: Height: 5\' 3"  (160 cm) Weight: 226 lb 11.2 oz (102.83 kg) IBW/kg (Calculated) : 52.4    Vital Signs: Temp: 97.6 F (36.4 C) (08/19 2257) Temp src: Oral (08/19 2257) BP: 145/89 mmHg (08/19 2257) Pulse Rate: 99  (08/19 2257)  Labs:  Basename 11/08/11 2339 11/08/11 2014  HGB -- 17.2*  HCT -- 49.5*  PLT -- 200  APTT -- --  LABPROT -- --  INR -- --  HEPARINUNFRC -- --  CREATININE -- 0.81  CKTOTAL 39 --  CKMB 1.7 --  TROPONINI <0.30 <0.30    Estimated Creatinine Clearance: 103.7 ml/min (by C-G formula based on Cr of 0.81).   Medical History: Past Medical History  Diagnosis Date  . Anxiety   . Cardiac angina   . Hypertension   . Panic attack   . Bipolar 1 disorder   . Myocardial infarction     Medications:  Scheduled:    . sodium chloride   Intravenous Once  . antiseptic oral rinse  15 mL Mouth Rinse BID  . aspirin      . aspirin  324 mg Oral NOW   Or  . aspirin  300 mg Rectal NOW  . aspirin  325 mg Oral Daily  . enoxaparin (LOVENOX) injection  100 mg Subcutaneous NOW  . enoxaparin (LOVENOX) injection  100 mg Subcutaneous Q12H  .  HYDROmorphone (DILAUDID) injection  1 mg Intravenous Once  .  HYDROmorphone (DILAUDID) injection  1 mg Intravenous Once  . lamoTRIgine  100 mg Oral QHS  . lisinopril  20 mg Oral Daily  . LORazepam  1 mg Intravenous Once  . metoprolol tartrate  12.5 mg Oral BID  .  mirtazapine  45 mg Oral QHS  . nitroGLYCERIN  0.5 inch Topical Q8H  . nitroGLYCERIN  1 inch Topical Once  . ondansetron      . pneumococcal 23 valent vaccine  0.5 mL Intramuscular Tomorrow-1000  . QUEtiapine  300 mg Oral QHS  . sodium chloride  3 mL Intravenous Q12H  . DISCONTD: sodium chloride   Intravenous STAT  . DISCONTD: aspirin EC  81 mg Oral Daily    Assessment: 41 yo obese F with hx of CAD admitted with chest pain. Normal renal function and CBC. Cardiac enzymes negative.  No hx bleeding noted.   Goal of Therapy:  Anti-Xa level 0.6-1.2 units/ml 4hrs after LMWH dose given Monitor platelets by anticoagulation protocol: Yes   Plan:  Lovenox 100mg  sq q12h CBC q3days while on Lovenox  Christine Davidson, Christine Davidson 11/09/2011,2:13 AM

## 2011-11-09 NOTE — Progress Notes (Signed)
UR Chart Review Completed  

## 2011-11-09 NOTE — Discharge Summary (Signed)
Physician Discharge Summary  Patient ID: Christine Davidson MRN: [id number] DOB/AGE: 1970/09/19 41 y.o.  Admit date: 11/08/2011 Discharge date: 11/09/2011  Discharge Diagnoses:  Principal Problem:  *Chest pain Active Problems:  CAD (coronary artery disease)  Anxiety  Hypertension  Bipolar 1 disorder  Panic attack tobacco abuse Morbid obesity  Medication List  As of 11/09/2011 10:47 AM   TAKE these medications         alprazolam 2 MG tablet   Commonly known as: XANAX   Take 2 mg by mouth daily as needed. For breakthrough anxiety      ALPRAZolam 2 MG 24 hr tablet   Commonly known as: XANAX XR   Take 2 mg by mouth 2 (two) times daily.      aspirin 325 MG tablet   Take 325 mg by mouth every morning.      GOODY HEADACHE PO   Take 1 packet by mouth as needed. For pain      lamoTRIgine 25 MG tablet   Commonly known as: LAMICTAL   Take 100 mg by mouth at bedtime.      lisinopril 20 MG tablet   Commonly known as: PRINIVIL,ZESTRIL   Take 20 mg by mouth every morning.      mirtazapine 45 MG tablet   Commonly known as: REMERON   Take 45 mg by mouth at bedtime.      nitroGLYCERIN 0.4 MG SL tablet   Commonly known as: NITROSTAT   Place 1 tablet (0.4 mg total) under the tongue every 5 (five) minutes x 3 doses as needed for chest pain.            Discharge Orders    Future Orders Please Complete By Expires   Diet - low sodium heart healthy      Discharge instructions      Comments:   Quit smoking   Activity as tolerated - No restrictions         Follow-up Information    Follow up with Chrystie Nose, MD.   Contact information:   [address] [place] [address] [phone]          Disposition: 01-Home or Self Care  Discharged Condition: stable  Consults:  none  Labs:   Results for orders placed during the hospital encounter of 11/08/11 (from the past 48 hour(s))  CBC     Status: Abnormal   Collection Time   11/08/11  8:14  PM      Component Value Range Comment   WBC 10.8 (*) 4.0 - 10.5 K/uL    RBC 5.25 (*) 3.87 - 5.11 MIL/uL    Hemoglobin 17.2 (*) 12.0 - 15.0 g/dL    HCT 56.3 (*) 87.5 - 46.0 %    MCV 94.3  78.0 - 100.0 fL    MCH 32.8  26.0 - 34.0 pg    MCHC 34.7  30.0 - 36.0 g/dL    RDW 64.3  32.9 - 51.8 %    Platelets 200  150 - 400 K/uL   BASIC METABOLIC PANEL     Status: Abnormal   Collection Time   11/08/11  8:14 PM      Component Value Range Comment   Sodium 137  135 - 145 mEq/L    Potassium 3.7  3.5 - 5.1 mEq/L    Chloride 102  96 - 112 mEq/L    CO2 23  19 - 32 mEq/L    Glucose, Bld 111 (*) 70 -  99 mg/dL    BUN 16  6 - 23 mg/dL    Creatinine, Ser 8.29  0.50 - 1.10 mg/dL    Calcium 9.9  8.4 - 56.2 mg/dL    GFR calc non Af Amer 88 (*) >90 mL/min    GFR calc Af Amer >90  >90 mL/min   PRO B NATRIURETIC PEPTIDE     Status: Normal   Collection Time   11/08/11  8:14 PM      Component Value Range Comment   Pro B Natriuretic peptide (BNP) 74.0  0 - 125 pg/mL   TROPONIN I     Status: Normal   Collection Time   11/08/11  8:14 PM      Component Value Range Comment   Troponin I <0.30  <0.30 ng/mL   HEPATIC FUNCTION PANEL     Status: Abnormal   Collection Time   11/08/11  8:18 PM      Component Value Range Comment   Total Protein 7.5  6.0 - 8.3 g/dL    Albumin 3.7  3.5 - 5.2 g/dL    AST 32  0 - 37 U/L    ALT 53 (*) 0 - 35 U/L    Alkaline Phosphatase 65  39 - 117 U/L    Total Bilirubin 0.4  0.3 - 1.2 mg/dL    Bilirubin, Direct 0.1  0.0 - 0.3 mg/dL    Indirect Bilirubin 0.3  0.3 - 0.9 mg/dL   CARDIAC PANEL(CRET KIN+CKTOT+MB+TROPI)     Status: Normal   Collection Time   11/08/11 11:39 PM      Component Value Range Comment   Total CK 39  7 - 177 U/L    CK, MB 1.7  0.3 - 4.0 ng/mL    Troponin I <0.30  <0.30 ng/mL    Relative Index RELATIVE INDEX IS INVALID  0.0 - 2.5   LIPID PANEL     Status: Abnormal   Collection Time   11/09/11  5:35 AM      Component Value Range Comment   Cholesterol 200  0 -  200 mg/dL    Triglycerides 130  <865 mg/dL    HDL 47  >78 mg/dL    Total CHOL/HDL Ratio 4.3      VLDL 29  0 - 40 mg/dL    LDL Cholesterol 469 (*) 0 - 99 mg/dL   CARDIAC PANEL(CRET KIN+CKTOT+MB+TROPI)     Status: Normal   Collection Time   11/09/11  5:36 AM      Component Value Range Comment   Total CK 50  7 - 177 U/L    CK, MB 2.2  0.3 - 4.0 ng/mL    Troponin I <0.30  <0.30 ng/mL    Relative Index RELATIVE INDEX IS INVALID  0.0 - 2.5   BASIC METABOLIC PANEL     Status: Normal   Collection Time   11/09/11  5:36 AM      Component Value Range Comment   Sodium 138  135 - 145 mEq/L    Potassium 4.1  3.5 - 5.1 mEq/L    Chloride 106  96 - 112 mEq/L    CO2 24  19 - 32 mEq/L    Glucose, Bld 99  70 - 99 mg/dL    BUN 14  6 - 23 mg/dL    Creatinine, Ser 6.29  0.50 - 1.10 mg/dL    Calcium 9.0  8.4 - 52.8 mg/dL    GFR calc non Af Amer >  90  >90 mL/min    GFR calc Af Amer >90  >90 mL/min   CBC     Status: Abnormal   Collection Time   11/09/11  5:36 AM      Component Value Range Comment   WBC 9.3  4.0 - 10.5 K/uL    RBC 4.95  3.87 - 5.11 MIL/uL    Hemoglobin 15.8 (*) 12.0 - 15.0 g/dL    HCT 16.1 (*) 09.6 - 46.0 %    MCV 95.6  78.0 - 100.0 fL    MCH 31.9  26.0 - 34.0 pg    MCHC 33.4  30.0 - 36.0 g/dL    RDW 04.5  40.9 - 81.1 %    Platelets 190  150 - 400 K/uL     Diagnostics:  Chest Portable 1 View  11/08/2011  *RADIOLOGY REPORT*  Clinical Data: Hypertension, chest pain  PORTABLE CHEST - 1 VIEW  Comparison: 06/10/2010  Findings: Lungs clear.  Heart size and pulmonary vascularity normal.  No effusion.  Visualized bones unremarkable.  IMPRESSION: No acute disease   Original Report Authenticated By: Osa Craver, M.D.     Procedures: none  EKG: Sinus tachycardia. Rate 102  Hospital Course: See H&P for complete admission details. Ms. Schwebke is a 41 year old white female who presented with substernal chest pain. She also had nausea and shortness of breath. She has a history of  previous bare-metal stents, continued tobacco abuse and noncompliance. She reports having had a stress test a year ago prior to a dental procedure. She sees Dr. Rennis Golden with [facility] heart and vascular and degrees per office. She has not seen him for about a year. She ran out of her nitroglycerin tablets. She usually has chest pain several times a month which improves after nitroglycerin. She took aspirin the day of admission. EKG showed nothing concerning. She was observed overnight, started on nitro paste, DVT prophylaxis, aspirin. MI was ruled out. She was encouraged to quit smoking cigarettes and marijuana. She's had no further chest pain. She may followup with Dr. Rennis Golden is an outpatient. I've given her a prescription for nitroglycerin sublingual.  Discharge Exam:  Blood pressure 107/75, pulse 81, temperature 98.3 F (36.8 C), temperature source Oral, resp. rate 20, height 5\' 3"  (1.6 m), weight 102.83 kg (226 lb 11.2 oz), last menstrual period 10/25/2011, SpO2 97.00%.  General: Asleep. Arousable. Comfortable. Lungs clear to auscultation bilaterally without wheezes rhonchi or rales Cardiovascular regular rate rhythm without murmurs gallops rubs Abdomen obese soft nontender Extremities no clubbing cyanosis or edema  Signed: Kenetra Hildenbrand L 11/09/2011, 10:47 AM

## 2011-11-09 NOTE — Progress Notes (Signed)
Patient's blood pressure this morning was 93/60. Patient had a nitro patch due at 0600. Doctor was notified and asked if that dose should be held. Doctor stated that it should be held. Old patch will be removed. Will continue to monitor patient.

## 2011-11-09 NOTE — Progress Notes (Signed)
*  PRELIMINARY RESULTS* Echocardiogram 2D Echocardiogram has been performed.  Christine Davidson 11/09/2011, 12:17 PM

## 2011-11-09 NOTE — Progress Notes (Signed)
Discharge instructions given on medication,and follow up visits,patient verbalized understanding.Prescriptions sent with patient.No c/o pain or discomfort noted.Accompanied by staff to an awaiting vehicle.

## 2012-03-27 ENCOUNTER — Other Ambulatory Visit: Payer: Self-pay | Admitting: Family Medicine

## 2012-03-27 ENCOUNTER — Ambulatory Visit
Admission: RE | Admit: 2012-03-27 | Discharge: 2012-03-27 | Disposition: A | Discharge status: Home / Self Care | Payer: Medicaid Other | Source: Ambulatory Visit | Attending: Family Medicine | Admitting: Family Medicine

## 2012-03-27 DIAGNOSIS — R609 Edema, unspecified: Secondary | ICD-10-CM

## 2012-03-27 DIAGNOSIS — R52 Pain, unspecified: Secondary | ICD-10-CM

## 2012-03-28 ENCOUNTER — Emergency Department (HOSPITAL_COMMUNITY)
Admission: EM | Admit: 2012-03-28 | Discharge: 2012-03-28 | Disposition: A | Discharge status: Home / Self Care | Payer: Medicaid Other | Attending: Emergency Medicine | Admitting: Emergency Medicine

## 2012-03-28 ENCOUNTER — Encounter (HOSPITAL_COMMUNITY): Payer: Self-pay

## 2012-03-28 DIAGNOSIS — F411 Generalized anxiety disorder: Secondary | ICD-10-CM | POA: Insufficient documentation

## 2012-03-28 DIAGNOSIS — Y9301 Activity, walking, marching and hiking: Secondary | ICD-10-CM | POA: Insufficient documentation

## 2012-03-28 DIAGNOSIS — Z8679 Personal history of other diseases of the circulatory system: Secondary | ICD-10-CM | POA: Insufficient documentation

## 2012-03-28 DIAGNOSIS — Z7982 Long term (current) use of aspirin: Secondary | ICD-10-CM | POA: Insufficient documentation

## 2012-03-28 DIAGNOSIS — I10 Essential (primary) hypertension: Secondary | ICD-10-CM | POA: Insufficient documentation

## 2012-03-28 DIAGNOSIS — W010XXA Fall on same level from slipping, tripping and stumbling without subsequent striking against object, initial encounter: Secondary | ICD-10-CM | POA: Insufficient documentation

## 2012-03-28 DIAGNOSIS — F329 Major depressive disorder, single episode, unspecified: Secondary | ICD-10-CM | POA: Insufficient documentation

## 2012-03-28 DIAGNOSIS — Z79899 Other long term (current) drug therapy: Secondary | ICD-10-CM | POA: Insufficient documentation

## 2012-03-28 DIAGNOSIS — M1990 Unspecified osteoarthritis, unspecified site: Secondary | ICD-10-CM | POA: Insufficient documentation

## 2012-03-28 DIAGNOSIS — Z8709 Personal history of other diseases of the respiratory system: Secondary | ICD-10-CM | POA: Insufficient documentation

## 2012-03-28 DIAGNOSIS — I509 Heart failure, unspecified: Secondary | ICD-10-CM | POA: Insufficient documentation

## 2012-03-28 DIAGNOSIS — Y92009 Unspecified place in unspecified non-institutional (private) residence as the place of occurrence of the external cause: Secondary | ICD-10-CM | POA: Insufficient documentation

## 2012-03-28 DIAGNOSIS — S62102A Fracture of unspecified carpal bone, left wrist, initial encounter for closed fracture: Secondary | ICD-10-CM

## 2012-03-28 DIAGNOSIS — M129 Arthropathy, unspecified: Secondary | ICD-10-CM | POA: Insufficient documentation

## 2012-03-28 DIAGNOSIS — Z8719 Personal history of other diseases of the digestive system: Secondary | ICD-10-CM | POA: Insufficient documentation

## 2012-03-28 DIAGNOSIS — I252 Old myocardial infarction: Secondary | ICD-10-CM | POA: Insufficient documentation

## 2012-03-28 DIAGNOSIS — F3289 Other specified depressive episodes: Secondary | ICD-10-CM | POA: Insufficient documentation

## 2012-03-28 DIAGNOSIS — S62109A Fracture of unspecified carpal bone, unspecified wrist, initial encounter for closed fracture: Secondary | ICD-10-CM | POA: Insufficient documentation

## 2012-03-28 DIAGNOSIS — F17200 Nicotine dependence, unspecified, uncomplicated: Secondary | ICD-10-CM | POA: Insufficient documentation

## 2012-03-28 DIAGNOSIS — I2510 Atherosclerotic heart disease of native coronary artery without angina pectoris: Secondary | ICD-10-CM | POA: Insufficient documentation

## 2012-03-28 MED ORDER — HYDROCODONE-ACETAMINOPHEN 7.5-325 MG PO TABS: 1.0000 | ORAL_TABLET | Freq: Four times a day (QID) | ORAL | Status: DC | PRN

## 2012-03-28 MED ORDER — MELOXICAM 15 MG PO TABS: ORAL_TABLET | ORAL | Status: DC

## 2012-03-28 MED ORDER — OXYCODONE-ACETAMINOPHEN 5-325 MG PO TABS
2.0000 | ORAL_TABLET | Freq: Once | ORAL | Status: AC
  Administered 2012-03-28: 2 via ORAL
  Filled 2012-03-28: qty 2

## 2012-03-28 MED ORDER — ONDANSETRON 4 MG PO TBDP
4.0000 mg | ORAL_TABLET | Freq: Once | ORAL | Status: AC
  Administered 2012-03-28: 4 mg via ORAL
  Filled 2012-03-28: qty 1

## 2012-03-28 NOTE — ED Notes (Signed)
Patient reports that she fell yesterday and had x-rays at [facility] imaging and tht it showed 2 fractures in her hand/wrist. Patient states she was told to come to the ED yesterday, but had to take her of her daughter and was unable to do so.

## 2012-03-28 NOTE — ED Provider Notes (Signed)
Patient fell and has an injury to her left wrist. Patient had x-rays done yesterday which showed impacted radius fracture and fracture of the ulnar styloid without angulation and  I have reviewed these films with PA Harris. She's had prior surgery in that same wrist by Dr. Magnus Ivan. She's going to talk to Dr. Magnus Ivan however her fracture is impacted and not angulated. Most likely she will be placed in a splint and followup with Dr. Magnus Ivan.   Medical screening examination/treatment/procedure(s) were performed by non-physician practitioner and as supervising physician I was immediately available for consultation/collaboration. Devoria Albe, MD, Armando Gang   Ward Givens, MD 03/28/12 [phone]

## 2012-03-28 NOTE — Progress Notes (Signed)
While in [facility] ED on 03/28/12 confirmed pcp as betti reese EPIC updated

## 2012-03-30 ENCOUNTER — Encounter (HOSPITAL_COMMUNITY): Payer: Self-pay | Admitting: *Deleted

## 2012-03-30 ENCOUNTER — Other Ambulatory Visit (HOSPITAL_COMMUNITY): Payer: Self-pay | Admitting: Orthopaedic Surgery

## 2012-03-30 NOTE — Progress Notes (Signed)
[facility] SURGERY INSTRUCTIONS GIVEN TO PT BY PHONE--SHE STATES SHE IS NOT ABLE TO GO TO DRUG STORE TO BUY HIBICLENS TO SHOWER WITH--THAT SHE MAY HAVE SOME FROM PREVIOUS SURGERIES- IF SO - SHE WOULD USE TONIGHT AND IN AM-IS AWARE OF PRECAUTIONS TO AVOID USE ON FACE, PRIVATE AREAS OR HAIR.  I HAVE REQUESTED LAST OFFICE NOTE, EKG, FROM DR. HILTY WITH [facility]--ANY REPORTS TO BE SENT IN AM.

## 2012-03-31 ENCOUNTER — Encounter (HOSPITAL_COMMUNITY): Payer: Self-pay | Admitting: Anesthesiology

## 2012-03-31 ENCOUNTER — Encounter (HOSPITAL_COMMUNITY): Payer: Self-pay | Admitting: *Deleted

## 2012-03-31 ENCOUNTER — Ambulatory Visit (HOSPITAL_COMMUNITY): Payer: Medicaid Other | Admitting: Anesthesiology

## 2012-03-31 ENCOUNTER — Ambulatory Visit (HOSPITAL_COMMUNITY)
Admission: RE | Admit: 2012-03-31 | Discharge: 2012-04-01 | Disposition: A | Discharge status: Home / Self Care | Payer: Medicaid Other | Source: Ambulatory Visit | Attending: Orthopaedic Surgery | Admitting: Orthopaedic Surgery

## 2012-03-31 ENCOUNTER — Encounter (HOSPITAL_COMMUNITY)
Admission: RE | Disposition: A | Discharge status: Home / Self Care | Payer: Self-pay | Source: Ambulatory Visit | Attending: Orthopaedic Surgery

## 2012-03-31 DIAGNOSIS — Z79899 Other long term (current) drug therapy: Secondary | ICD-10-CM | POA: Insufficient documentation

## 2012-03-31 DIAGNOSIS — I509 Heart failure, unspecified: Secondary | ICD-10-CM | POA: Insufficient documentation

## 2012-03-31 DIAGNOSIS — I2510 Atherosclerotic heart disease of native coronary artery without angina pectoris: Secondary | ICD-10-CM | POA: Insufficient documentation

## 2012-03-31 DIAGNOSIS — I252 Old myocardial infarction: Secondary | ICD-10-CM | POA: Insufficient documentation

## 2012-03-31 DIAGNOSIS — S52599A Other fractures of lower end of unspecified radius, initial encounter for closed fracture: Principal | ICD-10-CM | POA: Insufficient documentation

## 2012-03-31 DIAGNOSIS — S52502A Unspecified fracture of the lower end of left radius, initial encounter for closed fracture: Secondary | ICD-10-CM

## 2012-03-31 DIAGNOSIS — W19XXXA Unspecified fall, initial encounter: Secondary | ICD-10-CM | POA: Insufficient documentation

## 2012-03-31 DIAGNOSIS — K219 Gastro-esophageal reflux disease without esophagitis: Secondary | ICD-10-CM | POA: Insufficient documentation

## 2012-03-31 DIAGNOSIS — Z23 Encounter for immunization: Secondary | ICD-10-CM | POA: Insufficient documentation

## 2012-03-31 DIAGNOSIS — I10 Essential (primary) hypertension: Secondary | ICD-10-CM | POA: Insufficient documentation

## 2012-03-31 HISTORY — DX: Unspecified osteoarthritis, unspecified site: M19.90

## 2012-03-31 HISTORY — DX: Atherosclerotic heart disease of native coronary artery without angina pectoris: I25.10

## 2012-03-31 HISTORY — DX: Depression, unspecified: F32.A

## 2012-03-31 HISTORY — DX: Major depressive disorder, single episode, unspecified: F32.9

## 2012-03-31 HISTORY — DX: Pain, unspecified: R52

## 2012-03-31 HISTORY — DX: Gastro-esophageal reflux disease without esophagitis: K21.9

## 2012-03-31 HISTORY — PX: OPEN REDUCTION INTERNAL FIXATION (ORIF) DISTAL RADIAL FRACTURE: SHX5989

## 2012-03-31 HISTORY — DX: Other complications of anesthesia, initial encounter: T88.59XA

## 2012-03-31 HISTORY — DX: Heart failure, unspecified: I50.9

## 2012-03-31 HISTORY — DX: Peripheral vascular disease, unspecified: I73.9

## 2012-03-31 HISTORY — DX: Adverse effect of unspecified anesthetic, initial encounter: T41.45XA

## 2012-03-31 HISTORY — DX: Shortness of breath: R06.02

## 2012-03-31 LAB — CBC
HCT: 44 % (ref 36.0–46.0)
MCH: 31.8 pg (ref 26.0–34.0)
MCHC: 33.9 g/dL (ref 30.0–36.0)
MCV: 94 fL (ref 78.0–100.0)
RDW: 12.8 % (ref 11.5–15.5)

## 2012-03-31 LAB — BASIC METABOLIC PANEL
BUN: 13 mg/dL (ref 6–23)
Creatinine, Ser: 0.67 mg/dL (ref 0.50–1.10)
GFR calc Af Amer: >90 mL/min (ref >90)
GFR calc non Af Amer: >90 mL/min (ref >90)

## 2012-03-31 LAB — SURGICAL PCR SCREEN: Staphylococcus aureus: NEGATIVE

## 2012-03-31 SURGERY — OPEN REDUCTION INTERNAL FIXATION (ORIF) DISTAL RADIUS FRACTURE
Anesthesia: General | Site: Wrist | Laterality: Left | Wound class: Clean

## 2012-03-31 MED ORDER — MIRTAZAPINE 45 MG PO TABS
45.0000 mg | ORAL_TABLET | Freq: Every day | ORAL | Status: DC
  Administered 2012-03-31: 45 mg via ORAL
  Filled 2012-03-31 (×2): qty 1

## 2012-03-31 MED ORDER — HYDROMORPHONE HCL PF 1 MG/ML IJ SOLN
INTRAMUSCULAR | Status: AC
  Filled 2012-03-31: qty 1

## 2012-03-31 MED ORDER — MIDAZOLAM HCL 5 MG/5ML IJ SOLN
INTRAMUSCULAR | Status: DC | PRN
  Administered 2012-03-31: 2 mg via INTRAVENOUS

## 2012-03-31 MED ORDER — PROMETHAZINE HCL 25 MG/ML IJ SOLN: 6.2500 mg | INTRAMUSCULAR | Status: DC | PRN

## 2012-03-31 MED ORDER — MUPIROCIN 2 % EX OINT
TOPICAL_OINTMENT | Freq: Two times a day (BID) | CUTANEOUS | Status: DC
Start: 2012-03-31 — End: 2012-03-31
  Filled 2012-03-31: qty 22

## 2012-03-31 MED ORDER — METHOCARBAMOL 500 MG PO TABS
500.0000 mg | ORAL_TABLET | Freq: Four times a day (QID) | ORAL | Status: DC | PRN
  Administered 2012-04-01 (×2): 500 mg via ORAL
  Filled 2012-03-31 (×2): qty 1

## 2012-03-31 MED ORDER — LIDOCAINE HCL (CARDIAC) 20 MG/ML IV SOLN
INTRAVENOUS | Status: DC | PRN
  Administered 2012-03-31: 100 mg via INTRAVENOUS

## 2012-03-31 MED ORDER — 0.9 % SODIUM CHLORIDE (POUR BTL) OPTIME
TOPICAL | Status: DC | PRN
  Administered 2012-03-31: 1000 mL

## 2012-03-31 MED ORDER — OXYCODONE HCL 5 MG PO TABS: 5.0000 mg | ORAL_TABLET | ORAL | Status: DC | PRN

## 2012-03-31 MED ORDER — ALPRAZOLAM 1 MG PO TABS
1.0000 mg | ORAL_TABLET | Freq: Four times a day (QID) | ORAL | Status: DC
  Administered 2012-03-31 – 2012-04-01 (×2): 1 mg via ORAL
  Filled 2012-03-31 (×2): qty 1

## 2012-03-31 MED ORDER — LACTATED RINGERS IV SOLN
INTRAVENOUS | Status: DC
  Administered 2012-03-31: 20:00:00 via INTRAVENOUS

## 2012-03-31 MED ORDER — ZOLPIDEM TARTRATE 5 MG PO TABS: 5.0000 mg | ORAL_TABLET | Freq: Every evening | ORAL | Status: DC | PRN

## 2012-03-31 MED ORDER — DEXAMETHASONE SODIUM PHOSPHATE 10 MG/ML IJ SOLN
INTRAMUSCULAR | Status: DC | PRN
  Administered 2012-03-31: 10 mg via INTRAVENOUS

## 2012-03-31 MED ORDER — HYDROMORPHONE HCL PF 1 MG/ML IJ SOLN
INTRAMUSCULAR | Status: AC
  Administered 2012-04-01: 1 mg via INTRAVENOUS
  Filled 2012-03-31: qty 2

## 2012-03-31 MED ORDER — FENTANYL CITRATE 0.05 MG/ML IJ SOLN
INTRAMUSCULAR | Status: DC | PRN
  Administered 2012-03-31: 25 ug via INTRAVENOUS
  Administered 2012-03-31 (×4): 50 ug via INTRAVENOUS
  Administered 2012-03-31: 25 ug via INTRAVENOUS

## 2012-03-31 MED ORDER — PROPOFOL 10 MG/ML IV BOLUS
INTRAVENOUS | Status: DC | PRN
  Administered 2012-03-31 (×2): 20 mg via INTRAVENOUS
  Administered 2012-03-31: 200 mg via INTRAVENOUS

## 2012-03-31 MED ORDER — LACTATED RINGERS IV SOLN
INTRAVENOUS | Status: DC | PRN
  Administered 2012-03-31: 18:00:00 via INTRAVENOUS

## 2012-03-31 MED ORDER — OXYCODONE-ACETAMINOPHEN 10-650 MG PO TABS: 1.0000 | ORAL_TABLET | Freq: Four times a day (QID) | ORAL | Status: DC | PRN

## 2012-03-31 MED ORDER — LISINOPRIL 20 MG PO TABS
20.0000 mg | ORAL_TABLET | Freq: Every day | ORAL | Status: DC
  Administered 2012-03-31 – 2012-04-01 (×2): 20 mg via ORAL
  Filled 2012-03-31 (×2): qty 1

## 2012-03-31 MED ORDER — METOCLOPRAMIDE HCL 10 MG PO TABS: 5.0000 mg | ORAL_TABLET | Freq: Three times a day (TID) | ORAL | Status: DC | PRN

## 2012-03-31 MED ORDER — CLINDAMYCIN PHOSPHATE 900 MG/50ML IV SOLN
900.0000 mg | INTRAVENOUS | Status: AC
  Administered 2012-03-31: 900 mg via INTRAVENOUS

## 2012-03-31 MED ORDER — CLINDAMYCIN PHOSPHATE 900 MG/50ML IV SOLN
INTRAVENOUS | Status: AC
  Filled 2012-03-31: qty 50

## 2012-03-31 MED ORDER — LISINOPRIL-HYDROCHLOROTHIAZIDE 20-25 MG PO TABS: 1.0000 | ORAL_TABLET | Freq: Every morning | ORAL | Status: DC

## 2012-03-31 MED ORDER — HYDROCHLOROTHIAZIDE 25 MG PO TABS
25.0000 mg | ORAL_TABLET | Freq: Every day | ORAL | Status: DC
  Administered 2012-04-01: 25 mg via ORAL
  Filled 2012-03-31: qty 1

## 2012-03-31 MED ORDER — ONDANSETRON HCL 4 MG PO TABS: 4.0000 mg | ORAL_TABLET | Freq: Four times a day (QID) | ORAL | Status: DC | PRN

## 2012-03-31 MED ORDER — ONDANSETRON HCL 4 MG/2ML IJ SOLN: 4.0000 mg | Freq: Four times a day (QID) | INTRAMUSCULAR | Status: DC | PRN

## 2012-03-31 MED ORDER — HYDROMORPHONE HCL PF 1 MG/ML IJ SOLN: 0.2500 mg | INTRAMUSCULAR | Status: DC | PRN

## 2012-03-31 MED ORDER — METOCLOPRAMIDE HCL 5 MG/ML IJ SOLN: 5.0000 mg | Freq: Three times a day (TID) | INTRAMUSCULAR | Status: DC | PRN

## 2012-03-31 MED ORDER — DIPHENHYDRAMINE HCL 12.5 MG/5ML PO ELIX
12.5000 mg | ORAL_SOLUTION | ORAL | Status: DC | PRN
  Administered 2012-03-31: 25 mg via ORAL
  Filled 2012-03-31: qty 10

## 2012-03-31 MED ORDER — HYDROCODONE-ACETAMINOPHEN 5-325 MG PO TABS: 1.0000 | ORAL_TABLET | ORAL | Status: DC | PRN

## 2012-03-31 MED ORDER — SODIUM CHLORIDE 0.9 % IV SOLN: INTRAVENOUS | Status: DC

## 2012-03-31 MED ORDER — NITROGLYCERIN 0.4 MG SL SUBL: 0.4000 mg | SUBLINGUAL_TABLET | SUBLINGUAL | Status: DC | PRN

## 2012-03-31 MED ORDER — HYDROMORPHONE HCL PF 1 MG/ML IJ SOLN
1.0000 mg | INTRAMUSCULAR | Status: DC | PRN
  Administered 2012-04-01 (×2): 1 mg via INTRAVENOUS
  Filled 2012-03-31 (×4): qty 1

## 2012-03-31 MED ORDER — METHOCARBAMOL 100 MG/ML IJ SOLN
500.0000 mg | Freq: Four times a day (QID) | INTRAMUSCULAR | Status: DC | PRN
  Administered 2012-03-31: 500 mg via INTRAVENOUS
  Filled 2012-03-31: qty 5

## 2012-03-31 MED ORDER — ASPIRIN 325 MG PO TABS
325.0000 mg | ORAL_TABLET | ORAL | Status: DC
  Administered 2012-04-01: 325 mg via ORAL
  Filled 2012-03-31 (×2): qty 1

## 2012-03-31 MED ORDER — SUCCINYLCHOLINE CHLORIDE 20 MG/ML IJ SOLN
INTRAMUSCULAR | Status: DC | PRN
  Administered 2012-03-31: 100 mg via INTRAVENOUS

## 2012-03-31 MED ORDER — HYDROMORPHONE HCL PF 1 MG/ML IJ SOLN
0.2500 mg | INTRAMUSCULAR | Status: DC | PRN
  Administered 2012-03-31 (×6): 0.5 mg via INTRAVENOUS

## 2012-03-31 MED ORDER — ONDANSETRON HCL 4 MG/2ML IJ SOLN
INTRAMUSCULAR | Status: DC | PRN
  Administered 2012-03-31: 4 mg via INTRAVENOUS

## 2012-03-31 SURGICAL SUPPLY — 50 items
BAG SPEC THK2 15X12 ZIP CLS (MISCELLANEOUS) ×1
BAG ZIPLOCK 12X15 (MISCELLANEOUS) ×2 IMPLANT
BANDAGE ELASTIC 4 VELCRO ST LF (GAUZE/BANDAGES/DRESSINGS) ×1 IMPLANT
BANDAGE GAUZE ELAST BULKY 4 IN (GAUZE/BANDAGES/DRESSINGS) ×1 IMPLANT
BIT DRILL 2 FAST STEP (BIT) ×1 IMPLANT
BIT DRILL 2.5X4 QC (BIT) ×1 IMPLANT
CLOTH BEACON ORANGE TIMEOUT ST (SAFETY) ×2 IMPLANT
CUFF TOURN SGL QUICK 18 (TOURNIQUET CUFF) ×2 IMPLANT
DRAPE C-ARM 42X72 X-RAY (DRAPES) ×2 IMPLANT
DRAPE U-SHAPE 47X51 STRL (DRAPES) ×2 IMPLANT
DRSG ADAPTIC 3X8 NADH LF (GAUZE/BANDAGES/DRESSINGS) ×1 IMPLANT
DRSG PAD ABDOMINAL 8X10 ST (GAUZE/BANDAGES/DRESSINGS) ×1 IMPLANT
DURAPREP 26ML APPLICATOR (WOUND CARE) ×2 IMPLANT
ELECT REM PT RETURN 9FT ADLT (ELECTROSURGICAL) ×2
ELECTRODE REM PT RTRN 9FT ADLT (ELECTROSURGICAL) ×1 IMPLANT
GAUZE XEROFORM 5X9 LF (GAUZE/BANDAGES/DRESSINGS) ×1 IMPLANT
GLOVE BIO SURGEON STRL SZ7.5 (GLOVE) ×2 IMPLANT
GOWN STRL REIN XL XLG (GOWN DISPOSABLE) ×3 IMPLANT
KIT BASIN OR (CUSTOM PROCEDURE TRAY) ×2 IMPLANT
NS IRRIG 1000ML POUR BTL (IV SOLUTION) ×2 IMPLANT
PACK LOWER EXTREMITY WL (CUSTOM PROCEDURE TRAY) ×2 IMPLANT
PAD CAST 3X4 CTTN HI CHSV (CAST SUPPLIES) ×1 IMPLANT
PAD CAST 4YDX4 CTTN HI CHSV (CAST SUPPLIES) ×1 IMPLANT
PADDING CAST ABS 4INX4YD NS (CAST SUPPLIES) ×1
PADDING CAST ABS COTTON 4X4 ST (CAST SUPPLIES) IMPLANT
PADDING CAST COTTON 3X4 STRL (CAST SUPPLIES)
PADDING CAST COTTON 4X4 STRL (CAST SUPPLIES)
PEG SUBCHONDRAL SMOOTH 2.0X12 (Screw) ×1 IMPLANT
PEG SUBCHONDRAL SMOOTH 2.0X14 (Peg) ×2 IMPLANT
PEG SUBCHONDRAL SMOOTH 2.0X22 (Peg) ×1 IMPLANT
PEG SUBCHONDRAL SMOOTH 2.0X24 (Peg) ×1 IMPLANT
PLATE SHORT 21.6X48.9 NRRW LT (Plate) ×1 IMPLANT
POSITIONER SURGICAL ARM (MISCELLANEOUS) ×2 IMPLANT
SCREW BN 12X3.5XNS CORT TI (Screw) IMPLANT
SCREW CORT 3.5X12 (Screw) ×4 IMPLANT
SCREW CORT 3.5X14 LNG (Screw) ×1 IMPLANT
SCREW MULTI DIRECT 16MM (Screw) ×1 IMPLANT
SPONGE GAUZE 4X4 12PLY (GAUZE/BANDAGES/DRESSINGS) ×1 IMPLANT
STRIP CLOSURE SKIN 1/2X4 (GAUZE/BANDAGES/DRESSINGS) ×1 IMPLANT
SUT ETHILON 3 0 PS 1 (SUTURE) ×1 IMPLANT
SUT MNCRL AB 4-0 PS2 18 (SUTURE) ×1 IMPLANT
SUT VIC AB 0 CT1 27 (SUTURE)
SUT VIC AB 0 CT1 27XBRD ANTBC (SUTURE) ×2 IMPLANT
SUT VIC AB 2-0 CT1 27 (SUTURE)
SUT VIC AB 2-0 CT1 TAPERPNT 27 (SUTURE) ×1 IMPLANT
SUT VIC AB 2-0 CT2 27 (SUTURE) ×1 IMPLANT
TOWEL OR 17X26 10 PK STRL BLUE (TOWEL DISPOSABLE) ×4 IMPLANT
TOWEL OR NON WOVEN STRL DISP B (DISPOSABLE) ×2 IMPLANT
UNDERPAD 30X30 INCONTINENT (UNDERPADS AND DIAPERS) ×2 IMPLANT
WATER STERILE IRR 1500ML POUR (IV SOLUTION) ×1 IMPLANT

## 2012-03-31 NOTE — Brief Op Note (Signed)
03/31/2012  7:40 PM  PATIENT:  Christine Davidson  42 y.o. female  PRE-OPERATIVE DIAGNOSIS:  Left distal radius fracture  POST-OPERATIVE DIAGNOSIS:  Left distal radius fracture  PROCEDURE:  Procedure(s) (LRB) with comments: OPEN REDUCTION INTERNAL FIXATION (ORIF) DISTAL RADIAL FRACTURE (Left) - Open Reduction Internal Fixation left distal radius fracture  SURGEON:  Surgeon(s) and Role:    * Kathryne Hitch, MD - Primary  PHYSICIAN ASSISTANT:   ASSISTANTS: none   ANESTHESIA:   general  EBL:   min.  BLOOD ADMINISTERED:none  DRAINS: none   LOCAL MEDICATIONS USED:  NONE  SPECIMEN:  No Specimen  DISPOSITION OF SPECIMEN:  N/A  COUNTS:  YES  TOURNIQUET:  * Missing tourniquet times found for documented tourniquets in log:  79005 *  DICTATION: .Other Dictation: Dictation Number [id number]  PLAN OF CARE: Admit for overnight observation  PATIENT DISPOSITION:  PACU - hemodynamically stable.   Delay start of Pharmacological VTE agent (>24hrs) due to surgical blood loss or risk of bleeding: not applicable

## 2012-03-31 NOTE — Anesthesia Postprocedure Evaluation (Signed)
Anesthesia Post Note  Patient: Christine Davidson  Procedure(s) Performed: Procedure(s) (LRB): OPEN REDUCTION INTERNAL FIXATION (ORIF) DISTAL RADIAL FRACTURE (Left)  Anesthesia type: General  Patient location: PACU  Post pain: Pain level controlled  Post assessment: Post-op Vital signs reviewed  Last Vitals:  Filed Vitals:   03/31/12 1945  BP:   Pulse:   Temp: 36.8 C  Resp:     Post vital signs: Reviewed  Level of consciousness: sedated  Complications: No apparent anesthesia complications

## 2012-03-31 NOTE — Progress Notes (Addendum)
Pt received from PACU, VSS, oriented pt to room and unit.  C/o itching at this time.  Pt has medications ordered that her chart states she is allergic to.  For tonight I will use what is left on the [facility] that she is not allergic to but in am before discharge need to address pain medications for home that she is not allergic to.

## 2012-03-31 NOTE — H&P (Signed)
Christine Davidson is an 42 y.o. female.   Chief Complaint:   Left wrist pain; known distal radius fracture HPI:   42 yo female who a few days ago sustained a mechanical fall on an outstretched left hand.  With significant wrist pain, she came to the ER and was found to have a left distal radius fracture with impaction and displacement.  She wishes to proceed with surgery given the nature of her injury.  Past Medical History  Diagnosis Date  . Anxiety   . Cardiac angina   . Hypertension   . Panic attack   . Bipolar 1 disorder   . Myocardial infarction     2005 and 2002  stents placed both times  . Shortness of breath   . Peripheral vascular disease     pt states both legs  . CHF (congestive heart failure)     years ago - per pt  . GERD (gastroesophageal reflux disease)     no recent problems-no meds needed  . Arthritis     both wrists  . Pain     left wrist -pt fell sat 03/25/12 and fractured left wrist.  has splint--no other injuries  . Complication of anesthesia     pt states she was told she woke up during some surgeries-but she did not have any recall or awareness  . Coronary artery disease     Dr. Rennis Golden with [facility] is pt's cardiologist  . Depression     pt states "major depression"    Past Surgical History  Procedure Date  . Cardiac catheterization   . Wrist surgery   . Cesarean section   . Dental surgery   . Tmj arthroplasty   . Coronary angioplasty     stents    History reviewed. No pertinent family history. Social History:  reports that she has been smoking Cigarettes.  She has a 30 pack-year smoking history. She has never used smokeless tobacco. She reports that she uses illicit drugs (Marijuana). She reports that she does not drink alcohol.  Allergies:  Allergies  Allergen Reactions  . Hydrocodone Hives and Swelling    REACTION: Swelling, hives around injection site. Swelling of throat  . Codeine Hives and Swelling  . Morphine And Related  Hives and Swelling    REACTION: Swelling, hives around injection site. Swelling of throat  . Norco (Hydrocodone-Acetaminophen)     Hives, swelling  . Penicillins Other (See Comments)    REACTION: Childhood allergy  . Tylenol (Acetaminophen) Nausea And Vomiting and Other (See Comments)    REACTION: Upsets stomach/causes pain    Medications Prior to Admission  Medication Sig Dispense Refill  . ALPRAZolam (XANAX) 1 MG tablet Take 1 mg by mouth 4 (four) times daily.      Marland Kitchen aspirin 325 MG tablet Take 325 mg by mouth every morning.      . mirtazapine (REMERON) 45 MG tablet Take 45 mg by mouth at bedtime.      Marland Kitchen oxyCODONE-acetaminophen (PERCOCET) 10-650 MG per tablet Take 1-2 tablets by mouth every 6 (six) hours as needed. pain      . HYDROcodone-acetaminophen (NORCO) 7.5-325 MG per tablet Take 1 tablet by mouth every 6 (six) hours as needed.      Marland Kitchen lisinopril-hydrochlorothiazide (PRINZIDE,ZESTORETIC) 20-25 MG per tablet Take 1 tablet by mouth every morning.      . meloxicam (MOBIC) 15 MG tablet Take one with food in the morning.  10 tablet  0  .  nitroGLYCERIN (NITROSTAT) 0.4 MG SL tablet Place 1 tablet (0.4 mg total) under the tongue every 5 (five) minutes x 3 doses as needed for chest pain.  20 tablet  0    Results for orders placed during the hospital encounter of 03/31/12 (from the past 48 hour(s))  SURGICAL PCR SCREEN     Status: Normal   Collection Time   03/31/12  1:48 PM      Component Value Range Comment   MRSA, PCR NEGATIVE  NEGATIVE    Staphylococcus aureus NEGATIVE  NEGATIVE   CBC     Status: Normal   Collection Time   03/31/12  2:20 PM      Component Value Range Comment   WBC 6.4  4.0 - 10.5 K/uL    RBC 4.68  3.87 - 5.11 MIL/uL    Hemoglobin 14.9  12.0 - 15.0 g/dL    HCT 16.1  09.6 - 04.5 %    MCV 94.0  78.0 - 100.0 fL    MCH 31.8  26.0 - 34.0 pg    MCHC 33.9  30.0 - 36.0 g/dL    RDW 40.9  81.1 - 91.4 %    Platelets 188  150 - 400 K/uL   BASIC METABOLIC PANEL     Status:  Abnormal   Collection Time   03/31/12  2:20 PM      Component Value Range Comment   Sodium 137  135 - 145 mEq/L    Potassium 3.6  3.5 - 5.1 mEq/L    Chloride 103  96 - 112 mEq/L    CO2 27  19 - 32 mEq/L    Glucose, Bld 104 (*) 70 - 99 mg/dL    BUN 13  6 - 23 mg/dL    Creatinine, Ser 7.82  0.50 - 1.10 mg/dL    Calcium 9.0  8.4 - 95.6 mg/dL    GFR calc non Af Amer >90  >90 mL/min    GFR calc Af Amer >90  >90 mL/min    No results found.  Review of Systems  All other systems reviewed and are negative.    Blood pressure 141/89, pulse 116, temperature 98 F (36.7 C), resp. rate 20, last menstrual period 03/21/2012, SpO2 98.00%. Physical Exam  Constitutional: She is oriented to person, place, and time. She appears well-developed and well-nourished.  HENT:  Head: Normocephalic and atraumatic.  Eyes: Pupils are equal, round, and reactive to light.  Neck: Normal range of motion. Neck supple.  Cardiovascular: Normal rate and regular rhythm.   Respiratory: Effort normal and breath sounds normal.  GI: Soft. Bowel sounds are normal.  Musculoskeletal:       Left wrist: She exhibits decreased range of motion, bony tenderness, swelling and deformity.  Neurological: She is alert and oriented to person, place, and time.  Skin: Skin is warm and dry.  Psychiatric: She has a normal mood and affect.     Assessment/Plan Displaced left distal radius fracture 1) to the OR today for ORIF of her left wrist with a volar plate.  Bayle Calvo Y 03/31/2012, 4:09 PM

## 2012-03-31 NOTE — Anesthesia Preprocedure Evaluation (Addendum)
Anesthesia Evaluation  Patient identified by MRN, date of birth, ID band Patient awake    Reviewed: Allergy & Precautions, H&P , NPO status , Patient's Chart, lab work & pertinent test results  Airway Mallampati: III TM Distance: >3 FB Neck ROM: Full    Dental  (+) Edentulous Upper and Edentulous Lower   Pulmonary neg pulmonary ROS, shortness of breath, Current Smoker,  breath sounds clear to auscultation  Pulmonary exam normal       Cardiovascular hypertension, + angina + CAD, + Past MI, + Cardiac Stents, + Peripheral Vascular Disease and +CHF negative cardio ROS  Rhythm:Regular Rate:Normal     Neuro/Psych PSYCHIATRIC DISORDERS Anxiety Depression Bipolar Disorder negative neurological ROS     GI/Hepatic Neg liver ROS, GERD-  ,  Endo/Other  negative endocrine ROS  Renal/GU negative Renal ROS  negative genitourinary   Musculoskeletal negative musculoskeletal ROS (+)   Abdominal   Peds negative pediatric ROS (+)  Hematology negative hematology ROS (+)   Anesthesia Other Findings   Reproductive/Obstetrics negative OB ROS                          Anesthesia Physical Anesthesia Plan  ASA: III  Anesthesia Plan: General   Post-op Pain Management:    Induction: Intravenous  Airway Management Planned: Oral ETT  Additional Equipment:   Intra-op Plan:   Post-operative Plan: Extubation in OR  Informed Consent: I have reviewed the patients History and Physical, chart, labs and discussed the procedure including the risks, benefits and alternatives for the proposed anesthesia with the patient or authorized representative who has indicated his/her understanding and acceptance.   Dental advisory given  Plan Discussed with: CRNA  Anesthesia Plan Comments:         Anesthesia Quick Evaluation

## 2012-03-31 NOTE — Transfer of Care (Signed)
Immediate Anesthesia Transfer of Care Note  Patient: Christine Davidson  Procedure(s) Performed: Procedure(s) (LRB) with comments: OPEN REDUCTION INTERNAL FIXATION (ORIF) DISTAL RADIAL FRACTURE (Left) - Open Reduction Internal Fixation left distal radius fracture  Patient Location: PACU  Anesthesia Type:General  Level of Consciousness: sedated  Airway & Oxygen Therapy: Patient Spontanous Breathing and Patient connected to face mask oxygen  Post-op Assessment: Report given to PACU RN and Post -op Vital signs reviewed and stable  Post vital signs: Reviewed and stable  Complications: No apparent anesthesia complications

## 2012-04-01 MED ORDER — INFLUENZA VIRUS VACC SPLIT PF IM SUSP
0.5000 mL | Freq: Once | INTRAMUSCULAR | Status: AC
  Administered 2012-04-01: 0.5 mL via INTRAMUSCULAR
  Filled 2012-04-01: qty 0.5

## 2012-04-01 MED ORDER — ONDANSETRON HCL 4 MG PO TABS: 4.0000 mg | ORAL_TABLET | Freq: Three times a day (TID) | ORAL | Status: DC | PRN

## 2012-04-01 NOTE — Discharge Summary (Signed)
Physician Discharge Summary  Patient ID: Christine Davidson MRN: [id number] DOB/AGE: June 14, 1970 42 y.o.  Admit date: 03/31/2012 Discharge date: 04/01/2012  Admission Diagnoses: Left distal radius fracture Discharge Diagnoses:  Principal Problem:  *Distal radius fracture, left   Discharged Condition: stable  Hospital Course: Patient is a 42 year old woman who presented with a left distal radius fracture. She underwent open reduction internal fixation. Postoperatively she progressed well and was discharged to home in stable condition.  Consults: None  Significant Diagnostic Studies: labs: Routine labs  Treatments: surgery: See operative note  Discharge Exam: Blood pressure 107/74, pulse 104, temperature 98.6 F (37 C), resp. rate 18, height 5\' 4"  (1.626 m), weight 108.863 kg (240 lb), last menstrual period 03/21/2012, SpO2 96.00%. Incision/Wound: dressing clean dry and intact at time of discharge.  Disposition: 01-Home or Self Care  Discharge Orders    Future Orders Please Complete By Expires   Diet - low sodium heart healthy      Diet - low sodium heart healthy      Call MD / Call 911      Comments:   If you experience chest pain or shortness of breath, CALL 911 and be transported to the hospital emergency room.  If you develope a fever above 101 F, pus (white drainage) or increased drainage or redness at the wound, or calf pain, call your surgeon's office.   Constipation Prevention      Comments:   Drink plenty of fluids.  Prune juice may be helpful.  You may use a stool softener, such as Colace (over the counter) 100 mg twice a day.  Use MiraLax (over the counter) for constipation as needed.   Increase activity slowly as tolerated      Discharge instructions      Comments:   No heavy lifting with your left hand. Leave your current dressing on for the next 4-5 days; then you may remove your dressings, shower and get your incision wet daily. Place large band-aids daily over your  incision in 4-5 days.   Call MD / Call 911      Comments:   If you experience chest pain or shortness of breath, CALL 911 and be transported to the hospital emergency room.  If you develope a fever above 101 F, pus (white drainage) or increased drainage or redness at the wound, or calf pain, call your surgeon's office.   Constipation Prevention      Comments:   Drink plenty of fluids.  Prune juice may be helpful.  You may use a stool softener, such as Colace (over the counter) 100 mg twice a day.  Use MiraLax (over the counter) for constipation as needed.   Increase activity slowly as tolerated          Medication List     As of 04/01/2012  9:28 AM    TAKE these medications         ALPRAZolam 1 MG tablet   Commonly known as: XANAX   Take 1 mg by mouth 4 (four) times daily.      aspirin 325 MG tablet   Take 325 mg by mouth every morning.      HYDROcodone-acetaminophen 7.5-325 MG per tablet   Commonly known as: NORCO   Take 1 tablet by mouth every 6 (six) hours as needed.      lisinopril-hydrochlorothiazide 20-25 MG per tablet   Commonly known as: PRINZIDE,ZESTORETIC   Take 1 tablet by mouth every morning.  meloxicam 15 MG tablet   Commonly known as: MOBIC   Take one with food in the morning.      mirtazapine 45 MG tablet   Commonly known as: REMERON   Take 45 mg by mouth at bedtime.      nitroGLYCERIN 0.4 MG SL tablet   Commonly known as: NITROSTAT   Place 1 tablet (0.4 mg total) under the tongue every 5 (five) minutes x 3 doses as needed for chest pain.      ondansetron 4 MG tablet   Commonly known as: ZOFRAN   Take 1 tablet (4 mg total) by mouth every 8 (eight) hours as needed for nausea.      oxyCODONE-acetaminophen 10-650 MG per tablet   Commonly known as: PERCOCET   Take 1-2 tablets by mouth every 6 (six) hours as needed. pain           Follow-up Information    Follow up with Kathryne Hitch, MD. In 2 weeks.   Contact information:   [address]  [address] [place] [address] [phone]          Signed: Nadara Mustard 04/01/2012, 9:28 AM

## 2012-04-01 NOTE — Progress Notes (Signed)
Discharged from floor via w/c, spouse with pt. No changes in assessment. Christine Davidson   

## 2012-04-01 NOTE — Plan of Care (Signed)
Problem: Phase I Progression Outcomes Goal: Initial discharge plan identified Outcome: Progressing To be admitted for overnight only, to be dc home in am

## 2012-04-01 NOTE — Care Management Note (Signed)
    Page 1 of 1   04/01/2012     4:45:22 PM   CARE MANAGEMENT NOTE 04/01/2012  Patient:  Christine Davidson, Christine Davidson   Account Number:  [id number]  Date Initiated:  04/01/2012  Documentation initiated by:  Lanier Clam  Subjective/Objective Assessment:   ADMITTED W/L DISTAL RADIUS FX.     Action/Plan:   FROM HOME   Anticipated DC Date:  04/01/2012   Anticipated DC Plan:  HOME/SELF CARE      DC Planning Services  CM consult      Choice offered to / List presented to:             Status of service:  Completed, signed off Medicare Important Message given?   (If response is "NO", the following Medicare IM given date fields will be blank) Date Medicare IM given:   Date Additional Medicare IM given:    Discharge Disposition:  HOME/SELF CARE  Per UR Regulation:    If discussed at Long Length of Stay Meetings, dates discussed:    Comments:  04/01/12 Ambrea Hegler RN,BSN NCM [phone] NO D/C NEEDS, OR ORDERS.

## 2012-04-01 NOTE — Op Note (Signed)
Christine Davidson, TIPPING                ACCOUNT NO.:  [id number]  MEDICAL RECORD NO.:  [id number]  LOCATION:  1612                         FACILITY:  [facility]  PHYSICIAN:  Vanita Panda. Magnus Ivan, M.D.DATE OF BIRTH:  1971-01-28  DATE OF PROCEDURE:  03/31/2012 DATE OF DISCHARGE:                              OPERATIVE REPORT   PREOPERATIVE DIAGNOSIS:  Right displaced distal radius fracture.  POSTOPERATIVE DIAGNOSIS:  Right displaced extra-articular distal radius fracture.  PROCEDURE:  Open reduction and internal fixation of right distal radius fracture.  IMPLANTS:  [employer] distal volar radial plate.  SURGEON:  Vanita Panda. Magnus Ivan, M.D.  ANESTHESIA:  General.  ANTIBIOTICS:  IV clindamycin.  TOURNIQUET TIME:  Less than an hour.  BLOOD LOSS:  Minimal.  COMPLICATIONS:  None.  INDICATIONS:  Ms. Haser is a 42 year old female who had a mechanical fall on outstretched left wrist a few days ago.  She was found to have a displaced extra-articular distal radius fracture and placed in a splint. She is now presenting for definitive fixation of this fracture.  The risks and benefits of surgery were explained to her in detail and she does wish to proceed with surgery.  PROCEDURE DESCRIPTION:  After informed consent was obtained, the appropriate left wrist was marked.  She was brought to the operating room, placed supine on the operating table with left wrist on a radiolucent arm table.  General anesthesia was then obtained.  A nonsterile tourniquet was placed around her upper left arm, left hand, wrist and forearm were prepped and draped with DuraPrep and sterile drapes.  Time-out was called.  She was identified as correct patient, correct left wrist.  I then used an Esmarch to wrap out the wrist and tourniquet was inflated to 250 mm pressure.  I then made a volar approach to the wrist after skin incision dissected down between the flexor carpi radialis and the radial  artery.  I then was able to move the pronator quadratus from radial to ulnar and expose the fracture.  I pulled traction and with a Glorious Peach I was able to reduce the fracture anatomically.  I then chose a narrow and short [employer] DVR plate and placed this along the volar cortex.  I secured this with bicortical screws proximally and locking screws and pegs distally.  This was all done under direct visualization and direct fluoroscopy.  I was able to the wrist to flexion and extension and afterwards radioulnar deviation and was stable.  I then irrigated the soft tissues with normal saline solution.  I closed the incision with 2-0 Vicryl in the subcutaneous tissue, and interrupted 3-0 nylon on the skin.  Xeroform and a well- padded sterile dressing was applied.  She was awakened, extubated, and taken to the recovery room in a stable condition.  All final counts were correct.  There were no complications noted.     Vanita Panda. Magnus Ivan, M.D.     CYB/MEDQ  D:  03/31/2012  T:  04/01/2012  Job:  [id number]

## 2012-04-01 NOTE — Progress Notes (Signed)
Pt has been non compliant with keeping arm elevated. Explained again the purpose of keeping her hand above her heart. Pt states after breakfast she will elevate again.

## 2012-04-04 ENCOUNTER — Encounter (HOSPITAL_COMMUNITY): Payer: Self-pay | Admitting: Orthopaedic Surgery

## 2012-04-09 NOTE — ED Provider Notes (Signed)
History     CSN: [id number]  Arrival date & time 03/28/12  1143   First MD Initiated Contact with Patient 03/28/12 1215      Chief Complaint  Patient presents with  . Fall  . Wrist Injury  . Hand Injury    (Consider location/radiation/quality/duration/timing/severity/associated sxs/prior treatment) HPI Patient w/PMH of scaphoid repair of L wrist presents to ED with c/o fractured left wrist.  Patient with FOOSH injury to left wrist after falling yesterday. Seen by here PCP and sent for xrays at [facility] imaging. Xrays showed fracture of the distal left radius with extension into the distal radial articular surface with impaction of fracture. She co pain.,swelling and deformity. Denies paresthesias.  Past Medical History  Diagnosis Date  . Anxiety   . Cardiac angina   . Hypertension   . Panic attack   . Bipolar 1 disorder   . Myocardial infarction     2005 and 2002  stents placed both times  . Shortness of breath   . Peripheral vascular disease     pt states both legs  . CHF (congestive heart failure)     years ago - per pt  . GERD (gastroesophageal reflux disease)     no recent problems-no meds needed  . Arthritis     both wrists  . Pain     left wrist -pt fell sat 03/25/12 and fractured left wrist.  has splint--no other injuries  . Complication of anesthesia     pt states she was told she woke up during some surgeries-but she did not have any recall or awareness  . Coronary artery disease     Dr. Rennis Golden with [facility] is pt's cardiologist  . Depression     pt states "major depression"    Past Surgical History  Procedure Date  . Cardiac catheterization   . Wrist surgery   . Cesarean section   . Dental surgery   . Tmj arthroplasty   . Coronary angioplasty     stents  . Open reduction internal fixation (orif) distal radial fracture 03/31/2012    Procedure: OPEN REDUCTION INTERNAL FIXATION (ORIF) DISTAL RADIAL FRACTURE;  Surgeon: Kathryne Hitch, MD;  Location: [facility];  Service: Orthopedics;  Laterality: Left;  Open Reduction Internal Fixation left distal radius fracture    History reviewed. No pertinent family history.  History  Substance Use Topics  . Smoking status: Current Every Day Smoker -- 1.0 packs/day for 30 years    Types: Cigarettes  . Smokeless tobacco: Never Used  . Alcohol Use: No     Comment: last marijuana was 03/23/12 -denies use of any other recreational drugs    OB History    Grav Para Term Preterm Abortions TAB SAB Ect Mult Living                  Review of Systems Ten systems reviewed and are negative for acute change, except as noted in the HPI.   Allergies  Hydrocodone; Codeine; Morphine and related; Norco; Penicillins; and Tylenol  Home Medications   Current Outpatient Rx  Name  Route  Sig  Dispense  Refill  . ALPRAZOLAM 1 MG PO TABS   Oral   Take 1 mg by mouth 4 (four) times daily.         . ASPIRIN 325 MG PO TABS   Oral   Take 325 mg by mouth every morning.         Marland Kitchen LISINOPRIL-HYDROCHLOROTHIAZIDE 20-25  MG PO TABS   Oral   Take 1 tablet by mouth every morning.         Marland Kitchen MIRTAZAPINE 45 MG PO TABS   Oral   Take 45 mg by mouth at bedtime.         Marland Kitchen NITROGLYCERIN 0.4 MG SL SUBL   Sublingual   Place 1 tablet (0.4 mg total) under the tongue every 5 (five) minutes x 3 doses as needed for chest pain.   20 tablet   0   . HYDROCODONE-ACETAMINOPHEN 7.5-325 MG PO TABS   Oral   Take 1 tablet by mouth every 6 (six) hours as needed.         . MELOXICAM 15 MG PO TABS      Take one with food in the morning.   10 tablet   0   . ONDANSETRON HCL 4 MG PO TABS   Oral   Take 1 tablet (4 mg total) by mouth every 8 (eight) hours as needed for nausea.   20 tablet   0   . OXYCODONE-ACETAMINOPHEN 10-650 MG PO TABS   Oral   Take 1-2 tablets by mouth every 6 (six) hours as needed. pain   60 tablet   0     BP 133/94  Pulse 88  Temp 98 F (36.7 C)  Resp 22  SpO2 97%   LMP 03/21/2012  Physical Exam  Constitutional: She is oriented to person, place, and time. She appears well-developed and well-nourished. No distress.  HENT:  Head: Normocephalic and atraumatic.  Eyes: Conjunctivae normal are normal. No scleral icterus.  Neck: Normal range of motion.  Cardiovascular: Normal rate, regular rhythm, normal heart sounds and intact distal pulses.  Exam reveals no gallop and no friction rub.   No murmur heard. Pulmonary/Chest: Effort normal and breath sounds normal. No respiratory distress.  Abdominal: Soft. Bowel sounds are normal. She exhibits no distension and no mass. There is no tenderness. There is no guarding.  Musculoskeletal:       A left wrist exam was performed. SKIN: intact SWELLING: moderate DEFORMITY: distal forearm NEUROVASCULAR EXAM normal   Neurological: She is alert and oriented to person, place, and time.  Skin: Skin is warm and dry. She is not diaphoretic.    ED Course  Procedures (including critical care time)  Labs Reviewed - No data to display No results found.   1. Wrist fracture, left       MDM   Filed Vitals:   03/28/12 1401  BP: 133/94  Pulse:   Temp:   Resp:   patient given pain control. I have spoken with Dr. Magnus Ivan who did previous surgery.  Patient will be given splint and follow up with Dr. Magnus Ivan at his office tomorrow.  At this time there does not appear to be any evidence of an acute emergency medical condition and the patient appears stable for discharge with appropriate outpatient follow up.Diagnosis was discussed with patient who verbalizes understanding and is agreeable to discharge. Pt case discussed with Dr. Magnus Ivan and Dr. Devoria Albe who agrees with my plan.           Arthor Captain, PA-C 04/09/12 1922

## 2012-04-11 NOTE — ED Provider Notes (Signed)
See prior note   Ward Givens, MD 04/11/12 1909

## 2013-09-13 ENCOUNTER — Observation Stay (HOSPITAL_BASED_OUTPATIENT_CLINIC_OR_DEPARTMENT_OTHER)
Admission: EM | Admit: 2013-09-13 | Discharge: 2013-09-15 | Disposition: A | Discharge status: Home / Self Care | Payer: Medicaid Other | Attending: General Surgery | Admitting: General Surgery

## 2013-09-13 ENCOUNTER — Encounter (HOSPITAL_BASED_OUTPATIENT_CLINIC_OR_DEPARTMENT_OTHER): Payer: Self-pay | Admitting: Emergency Medicine

## 2013-09-13 ENCOUNTER — Emergency Department (HOSPITAL_BASED_OUTPATIENT_CLINIC_OR_DEPARTMENT_OTHER): Payer: Medicaid Other

## 2013-09-13 DIAGNOSIS — E669 Obesity, unspecified: Secondary | ICD-10-CM | POA: Diagnosis not present

## 2013-09-13 DIAGNOSIS — I10 Essential (primary) hypertension: Secondary | ICD-10-CM | POA: Diagnosis not present

## 2013-09-13 DIAGNOSIS — K8000 Calculus of gallbladder with acute cholecystitis without obstruction: Secondary | ICD-10-CM | POA: Diagnosis present

## 2013-09-13 DIAGNOSIS — F17200 Nicotine dependence, unspecified, uncomplicated: Secondary | ICD-10-CM | POA: Diagnosis not present

## 2013-09-13 DIAGNOSIS — I739 Peripheral vascular disease, unspecified: Secondary | ICD-10-CM | POA: Insufficient documentation

## 2013-09-13 DIAGNOSIS — I252 Old myocardial infarction: Secondary | ICD-10-CM | POA: Diagnosis not present

## 2013-09-13 DIAGNOSIS — N390 Urinary tract infection, site not specified: Secondary | ICD-10-CM | POA: Insufficient documentation

## 2013-09-13 DIAGNOSIS — F3130 Bipolar disorder, current episode depressed, mild or moderate severity, unspecified: Secondary | ICD-10-CM | POA: Diagnosis not present

## 2013-09-13 DIAGNOSIS — E278 Other specified disorders of adrenal gland: Secondary | ICD-10-CM

## 2013-09-13 DIAGNOSIS — I2510 Atherosclerotic heart disease of native coronary artery without angina pectoris: Secondary | ICD-10-CM | POA: Insufficient documentation

## 2013-09-13 DIAGNOSIS — Z7982 Long term (current) use of aspirin: Secondary | ICD-10-CM | POA: Diagnosis not present

## 2013-09-13 DIAGNOSIS — E279 Disorder of adrenal gland, unspecified: Secondary | ICD-10-CM | POA: Insufficient documentation

## 2013-09-13 DIAGNOSIS — Z79899 Other long term (current) drug therapy: Secondary | ICD-10-CM | POA: Insufficient documentation

## 2013-09-13 DIAGNOSIS — F411 Generalized anxiety disorder: Secondary | ICD-10-CM | POA: Diagnosis not present

## 2013-09-13 DIAGNOSIS — K8010 Calculus of gallbladder with chronic cholecystitis without obstruction: Secondary | ICD-10-CM | POA: Diagnosis not present

## 2013-09-13 DIAGNOSIS — K810 Acute cholecystitis: Secondary | ICD-10-CM

## 2013-09-13 HISTORY — DX: Disease of gallbladder, unspecified: K82.9

## 2013-09-13 LAB — CBC WITH DIFFERENTIAL/PLATELET
Basophils Absolute: 0 10*3/uL (ref 0.0–0.1)
Basophils Relative: 0 % (ref 0–1)
EOS PCT: 3 % (ref 0–5)
Eosinophils Absolute: 0.2 10*3/uL (ref 0.0–0.7)
HEMATOCRIT: 47.2 % — AB (ref 36.0–46.0)
HEMOGLOBIN: 16 g/dL — AB (ref 12.0–15.0)
LYMPHS ABS: 2.7 10*3/uL (ref 0.7–4.0)
LYMPHS PCT: 30 % (ref 12–46)
MCH: 32.8 pg (ref 26.0–34.0)
MCHC: 33.9 g/dL (ref 30.0–36.0)
MCV: 96.7 fL (ref 78.0–100.0)
MONO ABS: 0.7 10*3/uL (ref 0.1–1.0)
MONOS PCT: 8 % (ref 3–12)
NEUTROS ABS: 5.4 10*3/uL (ref 1.7–7.7)
Neutrophils Relative %: 59 % (ref 43–77)
Platelets: 198 10*3/uL (ref 150–400)
RBC: 4.88 MIL/uL (ref 3.87–5.11)
RDW: 13 % (ref 11.5–15.5)
WBC: 9.1 10*3/uL (ref 4.0–10.5)

## 2013-09-13 LAB — COMPREHENSIVE METABOLIC PANEL
ALT: 57 U/L — ABNORMAL HIGH (ref 0–35)
AST: 43 U/L — ABNORMAL HIGH (ref 0–37)
Albumin: 3.7 g/dL (ref 3.5–5.2)
Alkaline Phosphatase: 54 U/L (ref 39–117)
BILIRUBIN TOTAL: 0.5 mg/dL (ref 0.3–1.2)
BUN: 11 mg/dL (ref 6–23)
CALCIUM: 9.1 mg/dL (ref 8.4–10.5)
CHLORIDE: 103 meq/L (ref 96–112)
CO2: 25 meq/L (ref 19–32)
CREATININE: 0.8 mg/dL (ref 0.50–1.10)
GFR, EST NON AFRICAN AMERICAN: 88 mL/min — AB (ref >90)
GLUCOSE: 126 mg/dL — AB (ref 70–99)
Potassium: 3.7 mEq/L (ref 3.7–5.3)
Sodium: 140 mEq/L (ref 137–147)
Total Protein: 7.3 g/dL (ref 6.0–8.3)

## 2013-09-13 LAB — URINALYSIS, ROUTINE W REFLEX MICROSCOPIC
Bilirubin Urine: NEGATIVE
Glucose, UA: NEGATIVE mg/dL
KETONES UR: NEGATIVE mg/dL
Nitrite: NEGATIVE
PROTEIN: NEGATIVE mg/dL
Specific Gravity, Urine: 1.014 (ref 1.005–1.030)
UROBILINOGEN UA: 1 mg/dL (ref 0.0–1.0)
pH: 6 (ref 5.0–8.0)

## 2013-09-13 LAB — URINE MICROSCOPIC-ADD ON

## 2013-09-13 LAB — LIPASE, BLOOD: Lipase: 25 U/L (ref 11–59)

## 2013-09-13 MED ORDER — ONDANSETRON HCL 4 MG/2ML IJ SOLN
4.0000 mg | Freq: Once | INTRAMUSCULAR | Status: AC
  Administered 2013-09-13: 4 mg via INTRAVENOUS
  Filled 2013-09-13: qty 2

## 2013-09-13 MED ORDER — ONDANSETRON HCL 4 MG/2ML IJ SOLN
4.0000 mg | Freq: Once | INTRAMUSCULAR | Status: AC
  Administered 2013-09-13: 4 mg via INTRAVENOUS

## 2013-09-13 MED ORDER — HYDROMORPHONE HCL PF 1 MG/ML IJ SOLN
INTRAMUSCULAR | Status: AC
  Filled 2013-09-13: qty 1

## 2013-09-13 MED ORDER — CIPROFLOXACIN IN D5W 400 MG/200ML IV SOLN
400.0000 mg | Freq: Once | INTRAVENOUS | Status: AC
  Administered 2013-09-13: 400 mg via INTRAVENOUS
  Filled 2013-09-13: qty 200

## 2013-09-13 MED ORDER — HYDROMORPHONE HCL PF 1 MG/ML IJ SOLN
1.0000 mg | Freq: Once | INTRAMUSCULAR | Status: AC
  Administered 2013-09-13: 1 mg via INTRAVENOUS
  Filled 2013-09-13: qty 1

## 2013-09-13 MED ORDER — SODIUM CHLORIDE 0.9 % IV BOLUS (SEPSIS)
1000.0000 mL | Freq: Once | INTRAVENOUS | Status: AC
  Administered 2013-09-13: 1000 mL via INTRAVENOUS

## 2013-09-13 MED ORDER — HYDROMORPHONE HCL PF 1 MG/ML IJ SOLN
1.0000 mg | Freq: Once | INTRAMUSCULAR | Status: AC
  Administered 2013-09-13: 1 mg via INTRAVENOUS

## 2013-09-13 MED ORDER — METRONIDAZOLE IN NACL 5-0.79 MG/ML-% IV SOLN
500.0000 mg | Freq: Once | INTRAVENOUS | Status: AC
  Administered 2013-09-13: 500 mg via INTRAVENOUS
  Filled 2013-09-13: qty 100

## 2013-09-13 MED ORDER — ONDANSETRON HCL 4 MG/2ML IJ SOLN
INTRAMUSCULAR | Status: AC
  Filled 2013-09-13: qty 2

## 2013-09-13 NOTE — ED Notes (Signed)
MD at bedside. 

## 2013-09-13 NOTE — ED Notes (Addendum)
Pt states she was advised Sunday at [facility] ED that she has GB disease with need for surgery-also was told she had "ployps" and UTI with rx cipro that she took once then stopped because she vomited after

## 2013-09-13 NOTE — ED Notes (Signed)
request for records from [facility]

## 2013-09-13 NOTE — ED Provider Notes (Signed)
This chart was scribed for [place], DO by Elby Beck, ED Scribe. This patient was seen in room MH06/MH06 and the patient's care was started at 6:43 PM.  TIME SEEN: 6:43 PM  CHIEF COMPLAINT: Abdominal pain  HPI:  Christine Davidson is a 43 y.o. female who presents to the Emergency Department complaining of intermittent, right upper quadrant abdominal pain for the past week. She reports associated symptoms of nausea and vomiting, reduced appetite and constipation. She states that her last BM was 2 days ago. She is passing gas. She states that she was seen at [facility] regional ED 5 days ago for the same symptoms. She states that she had a CT scan and [place] at that visit. She reports that she was diagnosed with gallstones, "polyps" and a UTI, and was prescribed Cipro and Phenergan. She states that these medications have not been offering relief, and that her pain has persisted. She states that she was referred to a surgeon but needs a referral from her PCP which she has not gotten. She denies any dysuria or hematuria, hematochezia, melena, diarrhea. She is status post prior C-section. No sick contacts or recent travel, hospitalization. She states she's been eating mashed potatoes and pork and beans at home.   ROS: See HPI Constitutional: no fever, +appetite change Eyes: no drainage  ENT: no runny nose   Cardiovascular:  no chest pain  Resp: no SOB  GI: +nausea, +constipation, +abdominal pain GU: no dysuria Integumentary: no rash  Allergy: no hives  Musculoskeletal: no leg swelling  Neurological: no slurred speech ROS otherwise negative  PAST MEDICAL HISTORY/PAST SURGICAL HISTORY:  Past Medical History  Diagnosis Date  . Anxiety   . Cardiac angina   . Hypertension   . Panic attack   . Bipolar 1 disorder   . Myocardial infarction     2005 and 2002  stents placed both times  . Shortness of breath   . Peripheral vascular disease     pt states both legs  . CHF (congestive heart  failure)     years ago - per pt  . GERD (gastroesophageal reflux disease)     no recent problems-no meds needed  . Arthritis     both wrists  . Pain     left wrist -pt fell sat 03/25/12 and fractured left wrist.  has splint--no other injuries  . Complication of anesthesia     pt states she was told she woke up during some surgeries-but she did not have any recall or awareness  . Coronary artery disease     Dr. Debara Pickett with [facility] is pt's cardiologist  . Depression     pt states "major depression"  . Gallbladder disease     MEDICATIONS:  Prior to Admission medications   Medication Sig Start Date End Date Taking? Authorizing Provider  Ciprofloxacin (CIPRO PO) Take by mouth.   Yes Historical Provider, MD  PROMETHAZINE HCL PO Take by mouth.   Yes Historical Provider, MD  ALPRAZolam Duanne Moron) 1 MG tablet Take 1 mg by mouth 4 (four) times daily.    Historical Provider, MD  aspirin 325 MG tablet Take 325 mg by mouth every morning.    Historical Provider, MD  HYDROcodone-acetaminophen (NORCO) 7.5-325 MG per tablet Take 1 tablet by mouth every 6 (six) hours as needed. 03/28/12   Margarita Mail, PA-C  lisinopril-hydrochlorothiazide (PRINZIDE,ZESTORETIC) 20-25 MG per tablet Take 1 tablet by mouth every morning.    Historical Provider, MD  meloxicam (MOBIC) 15 MG tablet Take one with food in the morning. 03/28/12   Margarita Mail, PA-C  mirtazapine (REMERON) 45 MG tablet Take 45 mg by mouth at bedtime.    Historical Provider, MD  nitroGLYCERIN (NITROSTAT) 0.4 MG SL tablet Place 1 tablet (0.4 mg total) under the tongue every 5 (five) minutes x 3 doses as needed for chest pain. 11/09/11 11/08/12  Delfina Redwood, MD  ondansetron (ZOFRAN) 4 MG tablet Take 1 tablet (4 mg total) by mouth every 8 (eight) hours as needed for nausea. 04/01/12   Newt Minion, MD  oxyCODONE-acetaminophen (PERCOCET) 10-650 MG per tablet Take 1-2 tablets by mouth every 6 (six) hours as needed. pain 03/31/12    Mcarthur Rossetti, MD    ALLERGIES:  Allergies  Allergen Reactions  . Hydrocodone Hives and Swelling    REACTION: Swelling, hives around injection site. Swelling of throat  . Codeine Hives and Swelling  . Morphine And Related Hives and Swelling    REACTION: Swelling, hives around injection site. Swelling of throat  . Norco [Hydrocodone-Acetaminophen]     Hives, swelling  . Penicillins Other (See Comments)    REACTION: Childhood allergy  . Tylenol [Acetaminophen] Nausea And Vomiting and Other (See Comments)    REACTION: Upsets stomach/causes pain    SOCIAL HISTORY:  History  Substance Use Topics  . Smoking status: Current Every Day Smoker -- 1.00 packs/day for 30 years    Types: Cigarettes  . Smokeless tobacco: Never Used  . Alcohol Use: No    FAMILY HISTORY: No family history on file.  EXAM: BP 128/93  Pulse 104  Temp(Src) 99.1 F (37.3 C) (Oral)  Resp 16  Ht 5\' 3"  (1.6 m)  Wt 236 lb (107.049 kg)  BMI 41.82 kg/m2  SpO2 100%  LMP 09/11/2013 CONSTITUTIONAL: Alert and oriented and responds appropriately to questions; well-nourished; appears uncomfortable; non-toxic; not in distress HEAD: Normocephalic EYES: Conjunctivae clear, PERRL ENT: normal nose; no rhinorrhea; moist mucous membranes; pharynx without lesions noted NECK: Supple, no meningismus, no LAD  CARD: RRR; S1 and S2 appreciated; no murmurs, no clicks, no rubs, no gallops RESP: Normal chest excursion without splinting or tachypnea; breath sounds clear and equal bilaterally; no wheezes, no rhonchi, no rales,  ABD/GI: Normal bowel sounds; non-distended; soft, tender in the RUQ with a positive Murphy's sign, no rebound, no guarding, no peritoneal signs; no tenderness at McBurney's point BACK:  The back appears normal and is non-tender to palpation, there is no CVA tenderness EXT: Normal ROM in all joints; non-tender to palpation; no edema; normal capillary refill; no cyanosis    SKIN: Normal color for age  and race; warm NEURO: Moves all extremities equally PSYCH: The patient's mood and manner are appropriate. Grooming and personal hygiene are appropriate.  MEDICAL DECISION MAKING: Patient here with right upper quadrant abdominal pain with positive Murphy sign. She has a history of gallstones and recently had a full workup including ultrasound and CT scan hyperaeration the hospital. We'll obtain outside records. We'll repeat abdominal labs, urine and repeat ultrasound of her gallbladder. We'll give pain and nausea medication.  ED PROGRESS: Obtain the outside hospital records. Patient CT findings suggested acute cholecystitis with mild common bile duct dilatation with no obvious stone. They recommended a ultrasound for correlation. Ultrasound showed cholelithiasis with thickened gallbladder wall but no pericholecystic fluid or sonographic Murphy sign. She did have a mildly dilated common bile duct at 7 mm and hepatic steatosis. She had no leukocytosis, and normal  LFTs.  In our ED, patient has no leukocytosis but does have mild elevation of her AST and ALT. Urine, repeat ultrasound pending.  9:35 PM  Pt's repeat ultrasound is consistent with acute cholecystitis. We'll consult surgery. She does still appear to have urinary tract infection. Culture pending. She is allergic to penicillin. Will give Flagyl, Cipro.   9:52 PM  Spoke with Dr. Redmond Pulling surgery who will see patient at the [facility] ED.  spoke with Dr. Annetta Maw who agrees to accept the patient to the ED. Family opted with plan. We'll keep her n.p.o. at this time. She is not on any anticoagulation and has not been on aspirin since Monday.    I personally performed the services described in this documentation, which was scribed in my presence. The recorded information has been reviewed and is accurate.   [place], DO 09/13/13 2153

## 2013-09-13 NOTE — ED Notes (Signed)
Bed: WA04 Expected date:  Expected time:  Means of arrival:  Comments: Transfer from [place]

## 2013-09-14 ENCOUNTER — Observation Stay (HOSPITAL_COMMUNITY): Payer: Medicaid Other

## 2013-09-14 ENCOUNTER — Encounter (HOSPITAL_COMMUNITY)
Admission: EM | Disposition: A | Discharge status: Home / Self Care | Payer: Self-pay | Source: Home / Self Care | Attending: Emergency Medicine

## 2013-09-14 ENCOUNTER — Encounter (HOSPITAL_COMMUNITY): Payer: Medicaid Other | Admitting: Certified Registered Nurse Anesthetist

## 2013-09-14 ENCOUNTER — Observation Stay (HOSPITAL_COMMUNITY): Payer: Medicaid Other | Admitting: Certified Registered Nurse Anesthetist

## 2013-09-14 ENCOUNTER — Encounter (HOSPITAL_COMMUNITY): Payer: Self-pay | Admitting: Anesthesiology

## 2013-09-14 DIAGNOSIS — R1011 Right upper quadrant pain: Secondary | ICD-10-CM

## 2013-09-14 DIAGNOSIS — E278 Other specified disorders of adrenal gland: Secondary | ICD-10-CM

## 2013-09-14 DIAGNOSIS — I2510 Atherosclerotic heart disease of native coronary artery without angina pectoris: Secondary | ICD-10-CM | POA: Diagnosis not present

## 2013-09-14 DIAGNOSIS — K8010 Calculus of gallbladder with chronic cholecystitis without obstruction: Secondary | ICD-10-CM

## 2013-09-14 DIAGNOSIS — K810 Acute cholecystitis: Secondary | ICD-10-CM | POA: Diagnosis present

## 2013-09-14 DIAGNOSIS — I10 Essential (primary) hypertension: Secondary | ICD-10-CM | POA: Diagnosis not present

## 2013-09-14 DIAGNOSIS — K8000 Calculus of gallbladder with acute cholecystitis without obstruction: Secondary | ICD-10-CM | POA: Diagnosis present

## 2013-09-14 DIAGNOSIS — N390 Urinary tract infection, site not specified: Secondary | ICD-10-CM

## 2013-09-14 DIAGNOSIS — K8020 Calculus of gallbladder without cholecystitis without obstruction: Secondary | ICD-10-CM

## 2013-09-14 DIAGNOSIS — E279 Disorder of adrenal gland, unspecified: Secondary | ICD-10-CM | POA: Diagnosis not present

## 2013-09-14 HISTORY — PX: CHOLECYSTECTOMY: SHX55

## 2013-09-14 LAB — CBC
HEMATOCRIT: 45.1 % (ref 36.0–46.0)
Hemoglobin: 14.7 g/dL (ref 12.0–15.0)
MCH: 32.2 pg (ref 26.0–34.0)
MCHC: 32.6 g/dL (ref 30.0–36.0)
MCV: 98.7 fL (ref 78.0–100.0)
Platelets: 185 10*3/uL (ref 150–400)
RBC: 4.57 MIL/uL (ref 3.87–5.11)
RDW: 12.9 % (ref 11.5–15.5)
WBC: 6.4 10*3/uL (ref 4.0–10.5)

## 2013-09-14 LAB — COMPREHENSIVE METABOLIC PANEL
ALT: 50 U/L — ABNORMAL HIGH (ref 0–35)
AST: 35 U/L (ref 0–37)
Albumin: 3.2 g/dL — ABNORMAL LOW (ref 3.5–5.2)
Alkaline Phosphatase: 51 U/L (ref 39–117)
BUN: 10 mg/dL (ref 6–23)
CO2: 27 meq/L (ref 19–32)
CREATININE: 0.77 mg/dL (ref 0.50–1.10)
Calcium: 8.3 mg/dL — ABNORMAL LOW (ref 8.4–10.5)
Chloride: 103 mEq/L (ref 96–112)
Glucose, Bld: 177 mg/dL — ABNORMAL HIGH (ref 70–99)
Potassium: 3.6 mEq/L — ABNORMAL LOW (ref 3.7–5.3)
Sodium: 140 mEq/L (ref 137–147)
Total Bilirubin: 0.5 mg/dL (ref 0.3–1.2)
Total Protein: 6.5 g/dL (ref 6.0–8.3)

## 2013-09-14 LAB — SURGICAL PCR SCREEN
MRSA, PCR: NEGATIVE
Staphylococcus aureus: NEGATIVE

## 2013-09-14 LAB — URINE CULTURE
COLONY COUNT: NO GROWTH
CULTURE: NO GROWTH

## 2013-09-14 SURGERY — LAPAROSCOPIC CHOLECYSTECTOMY WITH INTRAOPERATIVE CHOLANGIOGRAM
Anesthesia: General | Site: Abdomen

## 2013-09-14 MED ORDER — LORAZEPAM 2 MG/ML IJ SOLN: 0.5000 mg | Freq: Four times a day (QID) | INTRAMUSCULAR | Status: DC | PRN

## 2013-09-14 MED ORDER — LISINOPRIL 20 MG PO TABS
20.0000 mg | ORAL_TABLET | Freq: Every day | ORAL | Status: DC
  Filled 2013-09-14: qty 1

## 2013-09-14 MED ORDER — LABETALOL HCL 5 MG/ML IV SOLN
INTRAVENOUS | Status: DC | PRN
  Administered 2013-09-14: 5 mg via INTRAVENOUS

## 2013-09-14 MED ORDER — FENTANYL CITRATE 0.05 MG/ML IJ SOLN
25.0000 ug | INTRAMUSCULAR | Status: DC | PRN
  Administered 2013-09-14 – 2013-09-15 (×5): 25 ug via INTRAVENOUS
  Administered 2013-09-15: 1 ug via INTRAVENOUS
  Administered 2013-09-15: 25 ug via INTRAVENOUS
  Filled 2013-09-14 (×7): qty 2

## 2013-09-14 MED ORDER — BUPIVACAINE-EPINEPHRINE 0.5% -1:200000 IJ SOLN: INTRAMUSCULAR | Status: DC | PRN

## 2013-09-14 MED ORDER — FENTANYL CITRATE 0.05 MG/ML IJ SOLN
25.0000 ug | INTRAMUSCULAR | Status: DC | PRN
  Administered 2013-09-14: 50 ug via INTRAVENOUS

## 2013-09-14 MED ORDER — LIDOCAINE HCL (CARDIAC) 20 MG/ML IV SOLN
INTRAVENOUS | Status: DC | PRN
  Administered 2013-09-14: 100 mg via INTRAVENOUS

## 2013-09-14 MED ORDER — NEOSTIGMINE METHYLSULFATE 10 MG/10ML IV SOLN
INTRAVENOUS | Status: AC
  Filled 2013-09-14: qty 1

## 2013-09-14 MED ORDER — LIDOCAINE HCL (CARDIAC) 20 MG/ML IV SOLN
INTRAVENOUS | Status: AC
  Filled 2013-09-14: qty 5

## 2013-09-14 MED ORDER — PROPOFOL 10 MG/ML IV BOLUS
INTRAVENOUS | Status: DC | PRN
  Administered 2013-09-14: 150 mg via INTRAVENOUS

## 2013-09-14 MED ORDER — GLYCOPYRROLATE 0.2 MG/ML IJ SOLN
INTRAMUSCULAR | Status: AC
  Filled 2013-09-14: qty 3

## 2013-09-14 MED ORDER — LACTATED RINGERS IR SOLN
Status: DC | PRN
  Administered 2013-09-14: 1

## 2013-09-14 MED ORDER — LISINOPRIL 40 MG PO TABS
40.0000 mg | ORAL_TABLET | Freq: Every day | ORAL | Status: DC
  Administered 2013-09-14 – 2013-09-15 (×2): 40 mg via ORAL
  Filled 2013-09-14 (×2): qty 1

## 2013-09-14 MED ORDER — DEXAMETHASONE SODIUM PHOSPHATE 10 MG/ML IJ SOLN
INTRAMUSCULAR | Status: AC
  Filled 2013-09-14: qty 1

## 2013-09-14 MED ORDER — PROMETHAZINE HCL 25 MG/ML IJ SOLN: 6.2500 mg | INTRAMUSCULAR | Status: DC | PRN

## 2013-09-14 MED ORDER — FENTANYL CITRATE 0.05 MG/ML IJ SOLN
50.0000 ug | INTRAMUSCULAR | Status: DC | PRN
  Administered 2013-09-14: 100 ug via INTRAVENOUS

## 2013-09-14 MED ORDER — SUCCINYLCHOLINE CHLORIDE 20 MG/ML IJ SOLN
INTRAMUSCULAR | Status: DC | PRN
  Administered 2013-09-14: 100 mg via INTRAVENOUS

## 2013-09-14 MED ORDER — IOHEXOL 300 MG/ML  SOLN
INTRAMUSCULAR | Status: DC | PRN
  Administered 2013-09-14: 10 mL

## 2013-09-14 MED ORDER — MIDAZOLAM HCL 2 MG/2ML IJ SOLN
INTRAMUSCULAR | Status: AC
  Filled 2013-09-14: qty 2

## 2013-09-14 MED ORDER — CIPROFLOXACIN IN D5W 400 MG/200ML IV SOLN
400.0000 mg | Freq: Two times a day (BID) | INTRAVENOUS | Status: DC
  Administered 2013-09-14: 400 mg via INTRAVENOUS
  Filled 2013-09-14 (×2): qty 200

## 2013-09-14 MED ORDER — POTASSIUM CHLORIDE IN NACL 20-0.9 MEQ/L-% IV SOLN
INTRAVENOUS | Status: DC
  Administered 2013-09-14: 25 mL via INTRAVENOUS
  Administered 2013-09-15: 04:00:00 via INTRAVENOUS
  Filled 2013-09-14 (×4): qty 1000

## 2013-09-14 MED ORDER — DEXAMETHASONE SODIUM PHOSPHATE 10 MG/ML IJ SOLN
INTRAMUSCULAR | Status: DC | PRN
  Administered 2013-09-14: 10 mg via INTRAVENOUS

## 2013-09-14 MED ORDER — LACTATED RINGERS IV SOLN
INTRAVENOUS | Status: DC
Start: 2013-09-14 — End: 2013-09-14
  Administered 2013-09-14: 1000 mL via INTRAVENOUS

## 2013-09-14 MED ORDER — BUPIVACAINE-EPINEPHRINE 0.25% -1:200000 IJ SOLN
INTRAMUSCULAR | Status: AC
  Filled 2013-09-14: qty 1

## 2013-09-14 MED ORDER — PROMETHAZINE HCL 25 MG PO TABS
25.0000 mg | ORAL_TABLET | ORAL | Status: DC | PRN
  Filled 2013-09-14: qty 1

## 2013-09-14 MED ORDER — FENTANYL CITRATE 0.05 MG/ML IJ SOLN
INTRAMUSCULAR | Status: AC
  Filled 2013-09-14: qty 2

## 2013-09-14 MED ORDER — PROPOFOL 10 MG/ML IV BOLUS
INTRAVENOUS | Status: AC
  Filled 2013-09-14: qty 20

## 2013-09-14 MED ORDER — TEMAZEPAM 15 MG PO CAPS
15.0000 mg | ORAL_CAPSULE | Freq: Every evening | ORAL | Status: DC | PRN
  Administered 2013-09-15: 15 mg via ORAL
  Filled 2013-09-14: qty 1

## 2013-09-14 MED ORDER — METOPROLOL TARTRATE 50 MG PO TABS
50.0000 mg | ORAL_TABLET | Freq: Two times a day (BID) | ORAL | Status: DC
  Administered 2013-09-14 – 2013-09-15 (×2): 50 mg via ORAL
  Filled 2013-09-14 (×3): qty 1

## 2013-09-14 MED ORDER — HYDROMORPHONE HCL PF 1 MG/ML IJ SOLN
1.0000 mg | Freq: Once | INTRAMUSCULAR | Status: AC
  Administered 2013-09-14: 1 mg via INTRAVENOUS
  Filled 2013-09-14: qty 1

## 2013-09-14 MED ORDER — ENOXAPARIN SODIUM 40 MG/0.4ML ~~LOC~~ SOLN
40.0000 mg | SUBCUTANEOUS | Status: DC
  Administered 2013-09-15: 40 mg via SUBCUTANEOUS
  Filled 2013-09-14 (×2): qty 0.4

## 2013-09-14 MED ORDER — NEOSTIGMINE METHYLSULFATE 10 MG/10ML IV SOLN
INTRAVENOUS | Status: DC | PRN
  Administered 2013-09-14: 5 mg via INTRAVENOUS

## 2013-09-14 MED ORDER — PANTOPRAZOLE SODIUM 40 MG IV SOLR
40.0000 mg | Freq: Every day | INTRAVENOUS | Status: DC
  Administered 2013-09-14: 40 mg via INTRAVENOUS
  Filled 2013-09-14 (×2): qty 40

## 2013-09-14 MED ORDER — ONDANSETRON HCL 4 MG/2ML IJ SOLN
4.0000 mg | Freq: Four times a day (QID) | INTRAMUSCULAR | Status: DC | PRN
Start: 2013-09-14 — End: 2013-09-14
  Administered 2013-09-14 (×2): 4 mg via INTRAVENOUS
  Filled 2013-09-14 (×3): qty 2

## 2013-09-14 MED ORDER — METOCLOPRAMIDE HCL 5 MG/ML IJ SOLN
INTRAMUSCULAR | Status: DC | PRN
  Administered 2013-09-14: 10 mg via INTRAVENOUS

## 2013-09-14 MED ORDER — MIDAZOLAM HCL 2 MG/2ML IJ SOLN
0.5000 mg | INTRAMUSCULAR | Status: DC | PRN
  Administered 2013-09-14: 2 mg via INTRAVENOUS

## 2013-09-14 MED ORDER — TRAMADOL HCL 50 MG PO TABS
100.0000 mg | ORAL_TABLET | Freq: Two times a day (BID) | ORAL | Status: DC | PRN
  Filled 2013-09-14: qty 2

## 2013-09-14 MED ORDER — BUPIVACAINE-EPINEPHRINE 0.25% -1:200000 IJ SOLN
INTRAMUSCULAR | Status: DC | PRN
  Administered 2013-09-14: 10 mL

## 2013-09-14 MED ORDER — ONDANSETRON HCL 4 MG/2ML IJ SOLN
4.0000 mg | INTRAMUSCULAR | Status: DC | PRN
  Administered 2013-09-14 (×2): 4 mg via INTRAVENOUS
  Filled 2013-09-14 (×2): qty 2

## 2013-09-14 MED ORDER — DIPHENHYDRAMINE HCL 12.5 MG/5ML PO ELIX: 12.5000 mg | ORAL_SOLUTION | Freq: Four times a day (QID) | ORAL | Status: DC | PRN

## 2013-09-14 MED ORDER — ONDANSETRON HCL 4 MG/2ML IJ SOLN
INTRAMUSCULAR | Status: DC | PRN
  Administered 2013-09-14: 4 mg via INTRAVENOUS

## 2013-09-14 MED ORDER — GLYCOPYRROLATE 0.2 MG/ML IJ SOLN
INTRAMUSCULAR | Status: DC | PRN
  Administered 2013-09-14: 0.6 mg via INTRAVENOUS

## 2013-09-14 MED ORDER — ESMOLOL HCL 10 MG/ML IV SOLN
INTRAVENOUS | Status: DC | PRN
  Administered 2013-09-14: 30 mg via INTRAVENOUS
  Administered 2013-09-14: 20 mg via INTRAVENOUS

## 2013-09-14 MED ORDER — ROCURONIUM BROMIDE 100 MG/10ML IV SOLN
INTRAVENOUS | Status: DC | PRN
  Administered 2013-09-14: 10 mg via INTRAVENOUS
  Administered 2013-09-14: 40 mg via INTRAVENOUS

## 2013-09-14 MED ORDER — NITROGLYCERIN 0.4 MG SL SUBL: 0.4000 mg | SUBLINGUAL_TABLET | SUBLINGUAL | Status: DC | PRN

## 2013-09-14 MED ORDER — MIDAZOLAM HCL 5 MG/5ML IJ SOLN
INTRAMUSCULAR | Status: DC | PRN
  Administered 2013-09-14 (×2): 1 mg via INTRAVENOUS

## 2013-09-14 MED ORDER — ESMOLOL HCL 10 MG/ML IV SOLN
INTRAVENOUS | Status: AC
  Filled 2013-09-14: qty 10

## 2013-09-14 MED ORDER — DIPHENHYDRAMINE HCL 50 MG/ML IJ SOLN: 12.5000 mg | Freq: Four times a day (QID) | INTRAMUSCULAR | Status: DC | PRN

## 2013-09-14 MED ORDER — FENTANYL CITRATE 0.05 MG/ML IJ SOLN
25.0000 ug | INTRAMUSCULAR | Status: DC | PRN
  Administered 2013-09-14: 50 ug via INTRAVENOUS
  Administered 2013-09-14: 25 ug via INTRAVENOUS
  Filled 2013-09-14 (×2): qty 2

## 2013-09-14 MED ORDER — KCL IN DEXTROSE-NACL 20-5-0.45 MEQ/L-%-% IV SOLN
INTRAVENOUS | Status: DC
  Administered 2013-09-14: 125 mL via INTRAVENOUS
  Filled 2013-09-14 (×3): qty 1000

## 2013-09-14 MED ORDER — FENTANYL CITRATE 0.05 MG/ML IJ SOLN
INTRAMUSCULAR | Status: AC
  Filled 2013-09-14: qty 5

## 2013-09-14 MED ORDER — ONDANSETRON HCL 4 MG/2ML IJ SOLN
INTRAMUSCULAR | Status: AC
  Filled 2013-09-14: qty 2

## 2013-09-14 MED ORDER — HYDROCHLOROTHIAZIDE 25 MG PO TABS
25.0000 mg | ORAL_TABLET | Freq: Every day | ORAL | Status: DC
  Filled 2013-09-14: qty 1

## 2013-09-14 MED ORDER — MIRTAZAPINE 30 MG PO TBDP
45.0000 mg | ORAL_TABLET | Freq: Every day | ORAL | Status: DC
  Administered 2013-09-14: 45 mg via ORAL
  Filled 2013-09-14 (×2): qty 1

## 2013-09-14 MED ORDER — LABETALOL HCL 5 MG/ML IV SOLN
INTRAVENOUS | Status: AC
  Filled 2013-09-14: qty 4

## 2013-09-14 MED ORDER — FENTANYL CITRATE 0.05 MG/ML IJ SOLN
INTRAMUSCULAR | Status: DC | PRN
  Administered 2013-09-14: 50 ug via INTRAVENOUS
  Administered 2013-09-14: 100 ug via INTRAVENOUS
  Administered 2013-09-14 (×2): 50 ug via INTRAVENOUS

## 2013-09-14 MED ORDER — LISINOPRIL-HYDROCHLOROTHIAZIDE 20-25 MG PO TABS: 1.0000 | ORAL_TABLET | Freq: Every morning | ORAL | Status: DC

## 2013-09-14 MED ORDER — CIPROFLOXACIN IN D5W 400 MG/200ML IV SOLN
INTRAVENOUS | Status: AC
  Filled 2013-09-14: qty 200

## 2013-09-14 SURGICAL SUPPLY — 38 items
ADH SKN CLS APL DERMABOND .7 (GAUZE/BANDAGES/DRESSINGS) ×1
APL SKNCLS STERI-STRIP NONHPOA (GAUZE/BANDAGES/DRESSINGS)
APPLIER CLIP ROT 10 11.4 M/L (STAPLE) ×3
APR CLP MED LRG 11.4X10 (STAPLE) ×1
BAG SPEC RTRVL LRG 6X4 10 (ENDOMECHANICALS) ×1
BENZOIN TINCTURE PRP APPL 2/3 (GAUZE/BANDAGES/DRESSINGS) IMPLANT
CANISTER SUCTION 2500CC (MISCELLANEOUS) ×3 IMPLANT
CLIP APPLIE ROT 10 11.4 M/L (STAPLE) ×1 IMPLANT
CLOSURE WOUND 1/2 X4 (GAUZE/BANDAGES/DRESSINGS)
COVER MAYO STAND STRL (DRAPES) ×3 IMPLANT
DECANTER SPIKE VIAL GLASS SM (MISCELLANEOUS) ×3 IMPLANT
DERMABOND ADVANCED (GAUZE/BANDAGES/DRESSINGS) ×2
DERMABOND ADVANCED .7 DNX12 (GAUZE/BANDAGES/DRESSINGS) ×1 IMPLANT
DRAPE C-ARM 42X120 X-RAY (DRAPES) ×3 IMPLANT
DRAPE LAPAROSCOPIC ABDOMINAL (DRAPES) ×3 IMPLANT
ELECT REM PT RETURN 9FT ADLT (ELECTROSURGICAL) ×3
ELECTRODE REM PT RTRN 9FT ADLT (ELECTROSURGICAL) ×1 IMPLANT
GLOVE EUDERMIC 7 POWDERFREE (GLOVE) ×3 IMPLANT
GOWN STRL REUS W/TWL XL LVL3 (GOWN DISPOSABLE) ×12 IMPLANT
HEMOSTAT SNOW SURGICEL 2X4 (HEMOSTASIS) IMPLANT
KIT BASIN OR (CUSTOM PROCEDURE TRAY) ×3 IMPLANT
POUCH SPECIMEN RETRIEVAL 10MM (ENDOMECHANICALS) ×3 IMPLANT
SCISSORS LAP 5X35 DISP (ENDOMECHANICALS) ×3 IMPLANT
SET CHOLANGIOGRAPH MIX (MISCELLANEOUS) ×3 IMPLANT
SET IRRIG TUBING LAPAROSCOPIC (IRRIGATION / IRRIGATOR) ×3 IMPLANT
SLEEVE XCEL OPT CAN 5 100 (ENDOMECHANICALS) ×3 IMPLANT
SOLUTION ANTI FOG 6CC (MISCELLANEOUS) ×3 IMPLANT
STRIP CLOSURE SKIN 1/2X4 (GAUZE/BANDAGES/DRESSINGS) IMPLANT
SUT MNCRL AB 4-0 PS2 18 (SUTURE) ×3 IMPLANT
TOWEL OR 17X26 10 PK STRL BLUE (TOWEL DISPOSABLE) ×3 IMPLANT
TOWEL OR NON WOVEN STRL DISP B (DISPOSABLE) ×3 IMPLANT
TRAY LAP CHOLE (CUSTOM PROCEDURE TRAY) ×3 IMPLANT
TROCAR BLADELESS OPT 5 100 (ENDOMECHANICALS) ×3 IMPLANT
TROCAR BLADELESS OPT 5 75 (ENDOMECHANICALS) ×2 IMPLANT
TROCAR SLEEVE XCEL 5X75 (ENDOMECHANICALS) ×2 IMPLANT
TROCAR XCEL BLUNT TIP 100MML (ENDOMECHANICALS) ×3 IMPLANT
TROCAR XCEL NON-BLD 11X100MML (ENDOMECHANICALS) ×3 IMPLANT
TUBING INSUFFLATION 10FT LAP (TUBING) ×3 IMPLANT

## 2013-09-14 NOTE — ED Provider Notes (Signed)
43 yo female with acute cholecystitis sent from [facility] for general surgery eval.  Dr. Redmond Pulling has been in to see patient and will admit.  She appears well, sitting upright in bed, abdomen soft but TTP in RUQ, normal mental status, normal perfusion, normal respiratory effort.  Will give additional dose of pain meds.  Admit to surgery.  Houston Siren III, MD 09/14/13 0600

## 2013-09-14 NOTE — Op Note (Signed)
Patient Name:           Christine Davidson   Date of Surgery:        09/14/2013  Pre op Diagnosis:      Acute and chronic cholecystitis with cholelithiasis  Post op Diagnosis:    Same  Procedure:                 Laparoscopic cholecystectomy with cholangiogram  Surgeon:                     Edsel Petrin. Dalbert Batman, M.D., FACS  Assistant:                      None  Operative Indications:   This is a 43 year old female who developed epigastric right upper quadrant pain approximately 5 or 6 days ago after eating a heavy meal. She did could not say comfortable overnight she wound up going [facility] patient's CT scan ultrasound which showed gallstones and borderline wall thickening. Did not think she was ill and she was discharged home. She continued to have pain daily which is worse in the afternoon in the morning with some nausea. The pain became more severe last minute she went to the emergency room and was admitted by Dr. Greer Pickerel. Lab work shows borderline elevation of AST and ALT. CT scan showed gallstones, normal common bile duct. There was bilateral adrenal nodules which appear benign and followup CT scan in 6 months was recommended.She was admitted for IV hydration, analgesics and antibiotics. She is brought to the operating room today for cholecystectomy  Operative Findings:       The gallbladder was chronically inflamed and somewhat acutely inflamed. It was relatively soft and somewhat thick walled with edema. There is extensive adhesions to the gallbladder which took some time to take down. The cholangiogram was normal showing normal intrahepatic and extrahepatic biliary anatomy, no filling defect, and no obstruction  with good flow of contrast into the duodenum. The liver was somewhat enlarged but otherwise looked normal. There were some omental adhesions around the umbilicus but no small bowel or colon adhesions. Small bowel and large bowel looked normal. The stomach looked normal.  Procedure  in Detail:          Following the induction of general endotracheal anesthesia the patient's abdomen was prepped and draped in a sterile fashion. Intravenous antibiotics were given. The abdomen was prepped and draped in a sterile fashion. Surgical time out was performed. 0.5% Marcaine with epinephrine was used as local infiltration anesthetic.      A vertical incision was made in the lower rim of the umbilicus, the fascia was incised and the abdominal cavity entered under direct  vision. A Hassan trocar was inserted and secured with a pursestring suture of 0 Vicryl. Pneumoperitoneum was created and the video camera inserted. An 11 millimeter trocar was placed in subxiphoid region. With the camera I looked back at the umbilical trocar and noted some omental adhesions but there was no evidence of any intestinal injury. Two 5 mm trocars were placed in the right upper quadrant. We could identify & grasp the fundus of the gallbladder. i spent  10 minutes or so taking down lots of adhesions from the gallbladder. We dissected out the cystic duct and cystic artery and created a nice window behind them and I could see the common bile duct just above the duodenum. Cholangiogram catheter was inserted into the cystic duct and a  cholangiogram was obtained. The cholangiogram was normal as described above. The cholangiogram catheter was removed, the cystic duct was secured with multiple metal clips and divided. The cystic artery was secured with multiple metal clips and divided. The gallbladder was dissected from its bed with electrocautery placed in a specimen bag and removed. The operative field, subhepatic space and subphrenic spaces were all irrigated extensively. At the completion of the case there was no bleeding and no bile leak. Irrigation fluid was clear. The trocars were removed and there was no bleeding there. The pneumoperitoneum was released. The fascia at the umbilicus was closed with 0 Vicryl sutures. Skin  incisions were closed with subcuticular sutures of 4-0 Monocryl and Dermabond. The patient tolerated the procedure well was taken to PACU in stable condition. EBL 20 cc. Counts correct. Complications none.     Edsel Petrin. Dalbert Batman, M.D., FACS General and Minimally Invasive Surgery Breast and Colorectal Surgery  09/14/2013 2:09 PM

## 2013-09-14 NOTE — Anesthesia Preprocedure Evaluation (Signed)
Anesthesia Evaluation  Patient identified by MRN, date of birth, ID band Patient awake    Reviewed: Allergy & Precautions, H&P , NPO status , Patient's Chart, lab work & pertinent test results  History of Anesthesia Complications (+) history of anesthetic complications  Airway Mallampati: II TM Distance: >3 FB Neck ROM: Full    Dental no notable dental hx.    Pulmonary shortness of breath, Current Smoker,  breath sounds clear to auscultation  Pulmonary exam normal       Cardiovascular hypertension, Pt. on medications and Pt. on home beta blockers + angina + CAD, + Past MI, + Peripheral Vascular Disease and +CHF Rhythm:Regular Rate:Normal  ECHO 2013 (for workup of SOB) normal with grade 1 diastolic dysfunction.   Neuro/Psych PSYCHIATRIC DISORDERS Anxiety Depression Bipolar Disorder negative neurological ROS     GI/Hepatic Neg liver ROS, GERD-  ,  Endo/Other  Morbid obesity  Renal/GU negative Renal ROS  negative genitourinary   Musculoskeletal negative musculoskeletal ROS (+)   Abdominal (+) + obese,   Peds negative pediatric ROS (+)  Hematology negative hematology ROS (+)   Anesthesia Other Findings   Reproductive/Obstetrics negative OB ROS                           Anesthesia Physical Anesthesia Plan  ASA: III  Anesthesia Plan: General   Post-op Pain Management:    Induction: Intravenous  Airway Management Planned: Oral ETT  Additional Equipment:   Intra-op Plan:   Post-operative Plan: Extubation in OR  Informed Consent: I have reviewed the patients History and Physical, chart, labs and discussed the procedure including the risks, benefits and alternatives for the proposed anesthesia with the patient or authorized representative who has indicated his/her understanding and acceptance.   Dental advisory given  Plan Discussed with: CRNA  Anesthesia Plan Comments:          Anesthesia Quick Evaluation

## 2013-09-14 NOTE — Progress Notes (Signed)
Utilization review completed.  

## 2013-09-14 NOTE — H&P (Addendum)
Christine Davidson is an 43 y.o. female.   Chief Complaint: abdominal pain HPI: 43 year old obese Caucasian female developed epigastric and right upper quadrant abdominal pain this past $RemoveB"Sunday after eating some steak. She thought she had bad gas. She tried to sleep but couldn't get comfortable. The pain was so intense that she ended up going [facility]. She states they did a CT scan and an ultrasound which showed gallstones. She apparently also was positive on urinalysis. She states that they detected the polyps in addition to her gallstones and wanted her to see a specialist. They sent her out on ciprofloxacin.  She continued to have abdominal discomfort. She generally would wake up without much discomfort however the pain was intensify throughout the day. She did have nausea. She describes the pain as a stabbing pain is mainly in her right upper abdomen. It would radiate to her back and sometimes down her right side. She is become constipated. Her last bowel movement was 3 days ago. She normally has daily bowel movements. She reports over the past year she has had similar symptoms but not nearly as intense. She denies any jaundice. She denies any melena or hematochezia. She denies any acholic stools. She used to to take a lot of Goody's powders but hasn't in months. The pain was so severe this evening that prompted her to go to urgent care where she was evaluated with a repeat ultrasound and labs.   I reviewed copies of her labs and CT scan and ultrasound from [facility]. Her only lab abnormalities other than her urinalysis on June 22 was AST of 31, ALT 44. On her CT scan she did have gallstones and no direct evidence of common bile duct dilatation. Her duct was maximally dilated at 7 mm. There is no mention of polyps. They did notice that she had bilateral adrenal nodules which appear benign however they did recommend followup CT scan in 6 months.  Past Medical History  Diagnosis Date  .  Anxiety   . Cardiac angina   . Hypertension   . Panic attack   . Bipolar 1 disorder   . Myocardial infarction     20"[id number]$ 05 and 2002  stents placed both times  . Shortness of breath   . Peripheral vascular disease     pt states both legs  . CHF (congestive heart failure)     years ago - per pt  . GERD (gastroesophageal reflux disease)     no recent problems-no meds needed  . Arthritis     both wrists  . Pain     left wrist -pt fell sat 03/25/12 and fractured left wrist.  has splint--no other injuries  . Complication of anesthesia     pt states she was told she woke up during some surgeries-but she did not have any recall or awareness  . Coronary artery disease     Dr. Debara Pickett with [facility] is pt's cardiologist  . Depression     pt states "major depression"  . Gallbladder disease     Past Surgical History  Procedure Laterality Date  . Cardiac catheterization    . Wrist surgery    . Cesarean section    . Dental surgery    . Tmj arthroplasty    . Coronary angioplasty      stents  . Open reduction internal fixation (orif) distal radial fracture  03/31/2012    Procedure: OPEN REDUCTION INTERNAL FIXATION (ORIF) DISTAL RADIAL FRACTURE;  Surgeon: Mcarthur Rossetti, MD;  Location: [facility];  Service: Orthopedics;  Laterality: Left;  Open Reduction Internal Fixation left distal radius fracture  . Tubal ligation      No family history on file. Social History:  reports that she has been smoking Cigarettes.  She has a 30 pack-year smoking history. She has never used smokeless tobacco. She reports that she uses illicit drugs (Marijuana). She reports that she does not drink alcohol.  Allergies:  Allergies  Allergen Reactions  . Hydrocodone Hives and Swelling    REACTION: Swelling, hives around injection site. Swelling of throat  . Codeine Hives and Swelling  . Morphine And Related Hives and Swelling    REACTION: Swelling, hives around injection site. Swelling of  throat  . Norco [Hydrocodone-Acetaminophen]     Hives, swelling  . Penicillins Other (See Comments)    REACTION: Childhood allergy  . Tylenol [Acetaminophen] Nausea And Vomiting and Other (See Comments)    REACTION: Upsets stomach/causes pain     (Not in a hospital admission)  Results for orders placed during the hospital encounter of 09/13/13 (from the past 48 hour(s))  CBC WITH DIFFERENTIAL     Status: Abnormal   Collection Time    09/13/13  6:59 PM      Result Value Ref Range   WBC 9.1  4.0 - 10.5 K/uL   RBC 4.88  3.87 - 5.11 MIL/uL   Hemoglobin 16.0 (*) 12.0 - 15.0 g/dL   HCT 47.2 (*) 36.0 - 46.0 %   MCV 96.7  78.0 - 100.0 fL   MCH 32.8  26.0 - 34.0 pg   MCHC 33.9  30.0 - 36.0 g/dL   RDW 13.0  11.5 - 15.5 %   Platelets 198  150 - 400 K/uL   Neutrophils Relative % 59  43 - 77 %   Neutro Abs 5.4  1.7 - 7.7 K/uL   Lymphocytes Relative 30  12 - 46 %   Lymphs Abs 2.7  0.7 - 4.0 K/uL   Monocytes Relative 8  3 - 12 %   Monocytes Absolute 0.7  0.1 - 1.0 K/uL   Eosinophils Relative 3  0 - 5 %   Eosinophils Absolute 0.2  0.0 - 0.7 K/uL   Basophils Relative 0  0 - 1 %   Basophils Absolute 0.0  0.0 - 0.1 K/uL  COMPREHENSIVE METABOLIC PANEL     Status: Abnormal   Collection Time    09/13/13  6:59 PM      Result Value Ref Range   Sodium 140  137 - 147 mEq/L   Potassium 3.7  3.7 - 5.3 mEq/L   Chloride 103  96 - 112 mEq/L   CO2 25  19 - 32 mEq/L   Glucose, Bld 126 (*) 70 - 99 mg/dL   BUN 11  6 - 23 mg/dL   Creatinine, Ser 0.80  0.50 - 1.10 mg/dL   Calcium 9.1  8.4 - 10.5 mg/dL   Total Protein 7.3  6.0 - 8.3 g/dL   Albumin 3.7  3.5 - 5.2 g/dL   AST 43 (*) 0 - 37 U/L   ALT 57 (*) 0 - 35 U/L   Alkaline Phosphatase 54  39 - 117 U/L   Total Bilirubin 0.5  0.3 - 1.2 mg/dL   GFR calc non Af Amer 88 (*) >90 mL/min   GFR calc Af Amer >90  >90 mL/min   Comment: (NOTE)     The eGFR has  been calculated using the CKD EPI equation.     This calculation has not been validated in all  clinical situations.     eGFR's persistently <90 mL/min signify possible Chronic Kidney     Disease.  LIPASE, BLOOD     Status: None   Collection Time    09/13/13  6:59 PM      Result Value Ref Range   Lipase 25  11 - 59 U/L  URINALYSIS, ROUTINE W REFLEX MICROSCOPIC     Status: Abnormal   Collection Time    09/13/13  8:51 PM      Result Value Ref Range   Color, Urine YELLOW  YELLOW   APPearance CLOUDY (*) CLEAR   Specific Gravity, Urine 1.014  1.005 - 1.030   pH 6.0  5.0 - 8.0   Glucose, UA NEGATIVE  NEGATIVE mg/dL   Hgb urine dipstick LARGE (*) NEGATIVE   Bilirubin Urine NEGATIVE  NEGATIVE   Ketones, ur NEGATIVE  NEGATIVE mg/dL   Protein, ur NEGATIVE  NEGATIVE mg/dL   Urobilinogen, UA 1.0  0.0 - 1.0 mg/dL   Nitrite NEGATIVE  NEGATIVE   Leukocytes, UA LARGE (*) NEGATIVE  URINE MICROSCOPIC-ADD ON     Status: Abnormal   Collection Time    09/13/13  8:51 PM      Result Value Ref Range   Squamous Epithelial / LPF FEW (*) RARE   WBC, UA TOO NUMEROUS TO COUNT  <3 WBC/hpf   RBC / HPF 11-20  <3 RBC/hpf   Bacteria, UA FEW (*) RARE   Urine-Other TRICHOMONAS PRESENT     US Abdomen Complete  09/13/2013   CLINICAL DATA:  Persistent right upper quadrant pain for 5 days with increasing intensity.  EXAM: ULTRASOUND ABDOMEN COMPLETE  COMPARISON:  09/10/2013  FINDINGS: Gallbladder:  Gallbladder is contracted with multiple stones resulting in wall echo shadow complex. There is gallbladder wall thickening. Possible mild pericholecystic edema. Murphy's sign is positive. Changes appear to demonstrate some progression since prior study.  Common bile duct:  Diameter: 6.1 mm, normal  Liver:  Diffusely increased hepatic parenchymal echotexture suggesting fatty infiltration.  IVC:  No abnormality visualized.  Pancreas:  Visualized portion unremarkable.  Spleen:  Size and appearance within normal limits.  Right Kidney:  Length: 12.2 cm. Echogenicity within normal limits. No mass or hydronephrosis visualized.   Left Kidney:  Length: 12.4 cm. Echogenicity within normal limits. No mass or hydronephrosis visualized.  Abdominal aorta:  No aneurysm visualized.  Other findings:  None.  IMPRESSION: Contracted gallbladder with multiple stones and wall thickening/ edema. Murphy's sign is positive. Appearance is appear to have progressed since previous study. Changes are consistent with acute cholecystitis in the appropriate clinical setting.   Electronically Signed   By: Lucienne Capers M.D.   On: 09/13/2013 21:15    Review of Systems  Constitutional: Negative for fever, chills and weight loss.  HENT: Negative for nosebleeds.   Eyes: Negative for blurred vision.  Respiratory: Negative for shortness of breath.   Cardiovascular: Positive for leg swelling (some ankle edema). Negative for chest pain, palpitations, orthopnea and PND.       + DOE; h/o bare metal stents; echo 2013 - wnl; sees Dr Debara Pickett; denies blood thinners other than ASA  Gastrointestinal: Positive for nausea, abdominal pain and constipation. Negative for blood in stool and melena.  Genitourinary: Negative for dysuria, urgency and hematuria.  Musculoskeletal: Negative.   Skin: Negative for itching and rash.  Neurological: Negative for dizziness, focal  weakness, seizures, loss of consciousness and headaches.       Denies TIAs, amaurosis fugax  Endo/Heme/Allergies: Does not bruise/bleed easily.  Psychiatric/Behavioral: The patient is not nervous/anxious.        "mental disorder" "bipolar"    Blood pressure 129/75, pulse 84, temperature 98.2 F (36.8 C), temperature source Oral, resp. rate 18, height $RemoveBe'5\' 3"'[id number]$  (1.6 m), weight 236 lb (107.049 kg), last menstrual period 09/11/2013, SpO2 96.00%. Physical Exam  Vitals reviewed. Constitutional: She is oriented to person, place, and time. She appears well-developed and well-nourished. No distress.  Appears older than stated age;obese  HENT:  Head: Normocephalic and atraumatic.  Right Ear: External ear  normal.  Left Ear: External ear normal.  Eyes: Conjunctivae are normal. No scleral icterus.  Neck: Normal range of motion. Neck supple. No tracheal deviation present. No thyromegaly present.  Cardiovascular: Normal rate and normal heart sounds.   Respiratory: Effort normal and breath sounds normal. No stridor. No respiratory distress. She has no wheezes.  GI: Soft. She exhibits no distension. There is tenderness in the right upper quadrant and epigastric area. There is positive Murphy's sign. There is no rigidity, no rebound and no guarding. No hernia.    +pannus; RUQ/Epigastric TTP. No rt/guarding/peritonitis  Musculoskeletal: She exhibits no edema and no tenderness.  Lymphadenopathy:    She has no cervical adenopathy.  Neurological: She is alert and oriented to person, place, and time. She exhibits normal muscle tone.  Skin: Skin is warm and dry. No rash noted. She is not diaphoretic. No erythema. No pallor.  Psychiatric: She has a normal mood and affect. Her behavior is normal. Judgment and thought content normal.  Flat affect     Assessment/Plan Acute calculus cholecystitis Urinary tract infection Coronary artery disease Hypertension Bipolar Anxiety Bilateral adrenal nodules Obesity  Admits for acute cholecystitis. N.p.o. IV fluids. IV antibiotics. Followup urine culture. Outpatient followup for adrenal nodules.  We'll discuss cholecystectomy in the a.m. With patient  Leighton Ruff. Redmond Pulling, MD, FACS General, Bariatric, & Minimally Invasive Surgery [facility], [place]   [facility] M 09/14/2013, 12:18 AM

## 2013-09-14 NOTE — OR Nursing (Signed)
Pt. C/o anxiety, increased as surgery delayed by previous case. Dr. Delma Post notified.Versed titrated for relief.

## 2013-09-14 NOTE — Anesthesia Postprocedure Evaluation (Signed)
  Anesthesia Post-op Note  Patient: Christine Davidson  Procedure(s) Performed: Procedure(s) (LRB): LAPAROSCOPIC CHOLECYSTECTOMY WITH INTRAOPERATIVE CHOLANGIOGRAM (N/A)  Patient Location: PACU  Anesthesia Type: General  Level of Consciousness: awake and alert   Airway and Oxygen Therapy: Patient Spontanous Breathing  Post-op Pain: mild  Post-op Assessment: Post-op Vital signs reviewed, Patient's Cardiovascular Status Stable, Respiratory Function Stable, Patent Airway and No signs of Nausea or vomiting  Last Vitals:  Filed Vitals:   09/14/13 1617  BP: 129/84  Pulse: 88  Temp: 36.9 C  Resp: 16    Post-op Vital Signs: stable   Complications: No apparent anesthesia complications

## 2013-09-14 NOTE — Progress Notes (Signed)
Subjective: Alert and stable. Afebrile. Heart rate 83. BP 114/77.Says she still got right upper quadrant pain and nausea. 1 episode of emesis last minute. Morning laboratory shows hemoglobin 14.7, WBC 6400, LFTs normal except for ALT of 50. Creatinine 0.77. Glucose 177. Objective: Vital signs in last 24 hours: Temp:  [97.6 F (36.4 C)-99.1 F (37.3 C)] 98 F (36.7 C) (06/26 0547) Pulse Rate:  [83-104] 83 (06/26 0547) Resp:  [16-18] 18 (06/26 0547) BP: (114-153)/(75-93) 114/77 mmHg (06/26 0547) SpO2:  [94 %-100 %] 94 % (06/26 0547) Weight:  [236 lb (107.049 kg)] 236 lb (107.049 kg) (06/25 1837) Last BM Date: 09/11/13  Intake/Output from previous day: 06/25 0701 - 06/26 0700 In: 902.9 [P.O.:480; I.V.:422.9] Out: 500 [Emesis/NG output:500] Intake/Output this shift:      EXAM: General: Alert. No acute distress. Appears to be slightly uncomfortable. Not toxic or ill. Alert and cooperative. Abdomen: Morbidly obese. Soft. No objective tenderness. No mass. Not distended. Well-healed Pfannenstiel incision.  Lab Results:   Recent Labs  09/13/13 1859 09/14/13 0452  WBC 9.1 6.4  HGB 16.0* 14.7  HCT 47.2* 45.1  PLT 198 185   BMET  Recent Labs  09/13/13 1859 09/14/13 0452  NA 140 140  K 3.7 3.6*  CL 103 103  CO2 25 27  GLUCOSE 126* 177*  BUN 11 10  CREATININE 0.80 0.77  CALCIUM 9.1 8.3*   PT/INR No results found for this basename: LABPROT, INR,  in the last 72 hours ABG No results found for this basename: PHART, PCO2, PO2, HCO3,  in the last 72 hours  Studies/Results: US Abdomen Complete  09/13/2013   CLINICAL DATA:  Persistent right upper quadrant pain for 5 days with increasing intensity.  EXAM: ULTRASOUND ABDOMEN COMPLETE  COMPARISON:  09/10/2013  FINDINGS: Gallbladder:  Gallbladder is contracted with multiple stones resulting in wall echo shadow complex. There is gallbladder wall thickening. Possible mild pericholecystic edema. Murphy's sign is positive.  Changes appear to demonstrate some progression since prior study.  Common bile duct:  Diameter: 6.1 mm, normal  Liver:  Diffusely increased hepatic parenchymal echotexture suggesting fatty infiltration.  IVC:  No abnormality visualized.  Pancreas:  Visualized portion unremarkable.  Spleen:  Size and appearance within normal limits.  Right Kidney:  Length: 12.2 cm. Echogenicity within normal limits. No mass or hydronephrosis visualized.  Left Kidney:  Length: 12.4 cm. Echogenicity within normal limits. No mass or hydronephrosis visualized.  Abdominal aorta:  No aneurysm visualized.  Other findings:  None.  IMPRESSION: Contracted gallbladder with multiple stones and wall thickening/ edema. Murphy's sign is positive. Appearance is appear to have progressed since previous study. Changes are consistent with acute cholecystitis in the appropriate clinical setting.   Electronically Signed   By: Lucienne Capers M.D.   On: 09/13/2013 21:15    Anti-infectives: Anti-infectives   Start     Dose/Rate Route Frequency Ordered Stop   09/14/13 1000  ciprofloxacin (CIPRO) IVPB 400 mg     400 mg 200 mL/hr over 60 Minutes Intravenous Every 12 hours 09/14/13 0114     09/13/13 2145  ciprofloxacin (CIPRO) IVPB 400 mg     400 mg 200 mL/hr over 60 Minutes Intravenous  Once 09/13/13 2133 09/13/13 2259   09/13/13 2145  metroNIDAZOLE (FLAGYL) IVPB 500 mg     500 mg 100 mL/hr over 60 Minutes Intravenous  Once 09/13/13 2133 09/13/13 2359      Assessment/Plan:  Acute cholecystitis with cholelithiasis. I think that she would benefit from cholecystectomy  today. She wholeheartedly agrees. We'll proceed with laparoscopic cholecystectomy with cholangiogram, possible open today.  I discussed the indications, details, techniques, and numerous risks of the surgery with her. She is aware of the risk of bleeding, infection, conversion to open laparotomy, postoperative bile leak, wound problems and hernias, injury to adjacent organs,  cardiac pulmonary and thromboembolic problems. She understands all these issues. All of her questions are asked her. She agrees with this plan.  Coronary artery disease, status post micro-infarction x2, status post stents. Followed by Dr. Duwayne Heck Hypertension Bipolar Anxiety Bilateral adrenal nodules Morbid obesity    LOS: 1 day    Fanny Skates M 09/14/2013

## 2013-09-14 NOTE — Transfer of Care (Signed)
Immediate Anesthesia Transfer of Care Note  Patient: Christine Davidson  Procedure(s) Performed: Procedure(s) (LRB): LAPAROSCOPIC CHOLECYSTECTOMY WITH INTRAOPERATIVE CHOLANGIOGRAM (N/A)  Patient Location: PACU  Anesthesia Type: General  Level of Consciousness: sedated, patient cooperative and responds to stimulation  Airway & Oxygen Therapy: Patient Spontanous Breathing and Patient connected to face mask oxgen  Post-op Assessment: Report given to PACU RN and Post -op Vital signs reviewed and stable  Post vital signs: Reviewed and stable  Complications: No apparent anesthesia complications

## 2013-09-15 MED ORDER — OXYCODONE HCL 5 MG PO TABS: 5.0000 mg | ORAL_TABLET | ORAL | Status: DC | PRN

## 2013-09-15 NOTE — Progress Notes (Signed)
1 Day Post-Op  Subjective: Feels better than she did preop.  Tolerating diet.  Objective: Vital signs in last 24 hours: Temp:  [97.5 F (36.4 C)-98.5 F (36.9 C)] 98 F (36.7 C) (06/27 1062) Pulse Rate:  [82-102] 95 (06/27 0608) Resp:  [14-21] 20 (06/27 0608) BP: (116-145)/(66-85) 122/80 mmHg (06/27 0608) SpO2:  [95 %-100 %] 97 % (06/27 0608) Last BM Date: 09/11/13  Intake/Output from previous day: 06/26 0701 - 06/27 0700 In: 2293.3 [P.O.:420; I.V.:1873.3] Out: 2900 [Urine:2900] Intake/Output this shift: Total I/O In: 240 [P.O.:240] Out: -   PE: General- In NAD Abdomen-soft, obese, incisions are clean and intact  Lab Results:   Recent Labs  09/13/13 1859 09/14/13 0452  WBC 9.1 6.4  HGB 16.0* 14.7  HCT 47.2* 45.1  PLT 198 185   BMET  Recent Labs  09/13/13 1859 09/14/13 0452  NA 140 140  K 3.7 3.6*  CL 103 103  CO2 25 27  GLUCOSE 126* 177*  BUN 11 10  CREATININE 0.80 0.77  CALCIUM 9.1 8.3*   PT/INR No results found for this basename: LABPROT, INR,  in the last 72 hours Comprehensive Metabolic Panel:    Component Value Date/Time   NA 140 09/14/2013 0452   NA 140 09/13/2013 1859   K 3.6* 09/14/2013 0452   K 3.7 09/13/2013 1859   CL 103 09/14/2013 0452   CL 103 09/13/2013 1859   CO2 27 09/14/2013 0452   CO2 25 09/13/2013 1859   BUN 10 09/14/2013 0452   BUN 11 09/13/2013 1859   CREATININE 0.77 09/14/2013 0452   CREATININE 0.80 09/13/2013 1859   GLUCOSE 177* 09/14/2013 0452   GLUCOSE 126* 09/13/2013 1859   CALCIUM 8.3* 09/14/2013 0452   CALCIUM 9.1 09/13/2013 1859   AST 35 09/14/2013 0452   AST 43* 09/13/2013 1859   ALT 50* 09/14/2013 0452   ALT 57* 09/13/2013 1859   ALKPHOS 51 09/14/2013 0452   ALKPHOS 54 09/13/2013 1859   BILITOT 0.5 09/14/2013 0452   BILITOT 0.5 09/13/2013 1859   PROT 6.5 09/14/2013 0452   PROT 7.3 09/13/2013 1859   ALBUMIN 3.2* 09/14/2013 0452   ALBUMIN 3.7 09/13/2013 1859     Studies/Results: Dg Cholangiogram Operative  09/14/2013    CLINICAL DATA:  Right upper quadrant abdominal pain, laparoscopic cholecystectomy  EXAM: INTRAOPERATIVE CHOLANGIOGRAM  FLUOROSCOPY TIME:  18 seconds  COMPARISON:  Abdominal ultrasound - 09/13/2013; 09/08/2013; abdominal CT - 09/10/2013  FINDINGS: Intraoperative angiographic images of the right upper abdominal quadrant during laparoscopic cholecystectomy are provided for review.  Multiple surgical clips overlie the expected location of the gallbladder fossa.  Contrast injection demonstrates selective cannulation of the central aspect of the cystic duct.  There is passage of contrast through the central aspect of the cystic duct with filling of a non dilated common bile duct. There is passage of contrast though the CBD and into the descending portion of the duodenum.  There is minimal reflux of injected contrast into the common hepatic duct and central aspect of the non dilated intrahepatic biliary system.  There are no discrete filling defects within the opacified portions of the biliary system to suggest the presence of choledocholithiasis.  IMPRESSION: No evidence of choledocholithiasis.   Electronically Signed   By: Sandi Mariscal M.D.   On: 09/14/2013 14:40   US Abdomen Complete  09/13/2013   CLINICAL DATA:  Persistent right upper quadrant pain for 5 days with increasing intensity.  EXAM: ULTRASOUND ABDOMEN COMPLETE  COMPARISON:  09/10/2013  FINDINGS: Gallbladder:  Gallbladder is contracted with multiple stones resulting in wall echo shadow complex. There is gallbladder wall thickening. Possible mild pericholecystic edema. Murphy's sign is positive. Changes appear to demonstrate some progression since prior study.  Common bile duct:  Diameter: 6.1 mm, normal  Liver:  Diffusely increased hepatic parenchymal echotexture suggesting fatty infiltration.  IVC:  No abnormality visualized.  Pancreas:  Visualized portion unremarkable.  Spleen:  Size and appearance within normal limits.  Right Kidney:  Length: 12.2 cm.  Echogenicity within normal limits. No mass or hydronephrosis visualized.  Left Kidney:  Length: 12.4 cm. Echogenicity within normal limits. No mass or hydronephrosis visualized.  Abdominal aorta:  No aneurysm visualized.  Other findings:  None.  IMPRESSION: Contracted gallbladder with multiple stones and wall thickening/ edema. Murphy's sign is positive. Appearance is appear to have progressed since previous study. Changes are consistent with acute cholecystitis in the appropriate clinical setting.   Electronically Signed   By: Lucienne Capers M.D.   On: 09/13/2013 21:15    Anti-infectives: Anti-infectives   Start     Dose/Rate Route Frequency Ordered Stop   09/14/13 1000  ciprofloxacin (CIPRO) IVPB 400 mg  Status:  Discontinued     400 mg 200 mL/hr over 60 Minutes Intravenous Every 12 hours 09/14/13 0114 09/14/13 1623   09/13/13 2145  ciprofloxacin (CIPRO) IVPB 400 mg     400 mg 200 mL/hr over 60 Minutes Intravenous  Once 09/13/13 2133 09/13/13 2259   09/13/13 2145  metroNIDAZOLE (FLAGYL) IVPB 500 mg     500 mg 100 mL/hr over 60 Minutes Intravenous  Once 09/13/13 2133 09/13/13 2359      Assessment Principal Problem:   Acute cholecystitis s/p cholecystectomy 6/26-doing well. Active Problems:   Adrenal mass-needs follow up CT in 6 months.       LOS: 2 days   Plan: Discharge.  Instructions given.   Christine Davidson Lenna Sciara 09/15/2013

## 2013-09-15 NOTE — Progress Notes (Signed)
Pt refused to take ultram for pain. States she is allergic to it.

## 2013-09-15 NOTE — Discharge Instructions (Signed)
[facility] ______[facility] LAPAROSCOPIC SURGERY: POST OP INSTRUCTIONS Always review your discharge instruction sheet given to you by the facility where your surgery was performed. IF YOU HAVE DISABILITY OR FAMILY LEAVE FORMS, YOU MUST BRING THEM TO THE OFFICE FOR PROCESSING.   DO NOT GIVE THEM TO YOUR DOCTOR.  1. A prescription for pain medication may be given to you upon discharge.  Take your pain medication as prescribed, if needed.  If narcotic pain medicine is not needed, then you may take acetaminophen (Tylenol) or ibuprofen (Advil) as needed. 2. Take your usually prescribed medications unless otherwise directed. 3. If you need a refill on your pain medication, please contact your pharmacy.  They will contact our office to request authorization. Prescriptions will not be filled after 5pm or on week-ends. 4. You should follow a lowfat diet.  Be sure to include lots of fluids daily. 5. Most patients will experience some swelling and bruising in the area of the incisions.  Ice packs will help.  Swelling and bruising can take several days to resolve.  6. It is common to experience some constipation if taking pain medication after surgery.  Increasing fluid intake and taking a stool softener (such as Colace) will usually help or prevent this problem from occurring.  A mild laxative (Milk of Magnesia or Miralax) should be taken according to package instructions if there are no bowel movements after 48 hours. 7. Unless discharge instructions indicate otherwise, you may remove your bandages 24-48 hours after surgery, and you may shower at that time.  You may have steri-strips (small skin tapes) in place directly over the incision.  These strips should be left on the skin for 7-10 days.  If your surgeon used skin glue on the incision, you may shower in 24 hours.  The glue will flake off over the next 2-3 weeks.  Any sutures or staples will be removed at the office during your follow-up  visit. 8. ACTIVITIES:  You may resume regular (light) daily activities beginning the next day--such as daily self-care, walking, climbing stairs--gradually increasing activities as tolerated.  You may have sexual intercourse when it is comfortable.  Refrain from any heavy lifting or straining-nothing over 10 pounds for 2 weeks. a. You may drive when you are no longer taking prescription pain medication, you can comfortably wear a seatbelt, and you can safely maneuver your car and apply brakes. b. RETURN TO WORK:  __________________________________________________________ 9. You should see your doctor in the office for a follow-up appointment approximately 2-3 weeks after your surgery.  Make sure that you call for this appointment within a day or two after you arrive home to insure a convenient appointment time. 10. OTHER INSTRUCTIONS: __________________________________________________________________________________________________________________________ __________________________________________________________________________________________________________________________ WHEN TO CALL YOUR DOCTOR: 1. Fever over 101.0 2. Inability to urinate 3. Continued bleeding from incision. 4. Increased pain, redness, or drainage from the incision. 5. Increasing abdominal pain  The clinic staff is available to answer your questions during regular business hours.  Please dont hesitate to call and ask to speak to one of the nurses for clinical concerns.  If you have a medical emergency, go to the nearest emergency room or call 911.  A surgeon from [facility] is always on call at the hospital. [address], [place], [place], [place]  [address] ? [address], [place], [place]   [address] [phone] ? [phone] ? FAX [phone] Web site: [email or web address]

## 2013-09-16 NOTE — Discharge Summary (Signed)
Physician Discharge Summary  Patient ID: Christine Davidson MRN: [id number] DOB/AGE: 1970/11/12 43 y.o.  Admit date: 09/13/2013 Discharge date: 09/15/2013  Admission Diagnoses:  Acute cholecystitis with cholelithiasis  Discharge Diagnoses:  Principal Problem:   Acute cholecystitis Active Problems:   Adrenal mass      Discharged Condition: good  Hospital Course: She was admitted and underwent laparoscopic cholecystectomy on 09/14/2013 which he tolerated well. She was able to be discharged on her first postoperative day. She does have a Adrenal mass that was noted on her CT scan [place]. It was recommended that she have a repeat CT scan in 6 months. This has been discussed with her. Other discharge instructions were given to her as well.  Consults: None  Significant Diagnostic Studies: none  Treatments: surgery: Laparoscopic cholecystectomy with cholangiogram  Discharge Exam: Blood pressure 122/80, pulse 95, temperature 98 F (36.7 C), temperature source Oral, resp. rate 20, height 5\' 3"  (1.6 m), weight 236 lb (107.049 kg), last menstrual period 09/11/2013, SpO2 97.00%.   Disposition: 01-Home or Self Care     Medication List    STOP taking these medications       ciprofloxacin 500 MG tablet  Commonly known as:  CIPRO      TAKE these medications       alprazolam 2 MG tablet  Commonly known as:  XANAX  Take 2 mg by mouth 2 (two) times daily.     aspirin 325 MG tablet  Take 325 mg by mouth every morning.     estazolam 2 MG tablet  Commonly known as:  PROSOM  Take 2 mg by mouth at bedtime.     lisinopril 40 MG tablet  Commonly known as:  PRINIVIL,ZESTRIL  Take 40 mg by mouth daily.     metoprolol 50 MG tablet  Commonly known as:  LOPRESSOR  Take 50 mg by mouth 2 (two) times daily.     mirtazapine 45 MG disintegrating tablet  Commonly known as:  REMERON SOL-TAB  Take 45 mg by mouth at bedtime.     nitroGLYCERIN 0.4 MG SL tablet  Commonly known as:   NITROSTAT  Place 0.4 mg under the tongue every 5 (five) minutes as needed for chest pain.     oxyCODONE 5 MG immediate release tablet  Commonly known as:  Oxy IR/ROXICODONE  Take 1-2 tablets (5-10 mg total) by mouth every 4 (four) hours as needed.     promethazine 25 MG tablet  Commonly known as:  PHENERGAN  Take 25 mg by mouth every 4 (four) hours as needed for nausea or vomiting.         Signed: Odis Hollingshead 09/16/2013, 3:08 PM

## 2013-09-17 ENCOUNTER — Encounter (HOSPITAL_COMMUNITY): Payer: Self-pay | Admitting: General Surgery

## 2013-09-19 ENCOUNTER — Telehealth (INDEPENDENT_AMBULATORY_CARE_PROVIDER_SITE_OTHER): Payer: Self-pay

## 2013-09-19 ENCOUNTER — Other Ambulatory Visit (INDEPENDENT_AMBULATORY_CARE_PROVIDER_SITE_OTHER): Payer: Self-pay

## 2013-09-19 MED ORDER — OXYCODONE HCL 5 MG PO TABS: 5.0000 mg | ORAL_TABLET | ORAL | Status: DC | PRN

## 2013-09-19 NOTE — Telephone Encounter (Signed)
Christine Davidson will have you sign rx when you come into office today.

## 2013-09-19 NOTE — Telephone Encounter (Signed)
OK oxycodone #30.   No refill.  hmi

## 2013-09-19 NOTE — Telephone Encounter (Signed)
Informed patient the oxycodone 5mg  # 40  IS AT FRONT DESK FOR PICK UP AND THIS IS THE LAST REFILL PER DR. Dalbert Batman , Advised patient to start weaning herself off oxycodone and to try taking tylenol or ibuprofen in between the oxy. Patient verbalized understanding

## 2013-09-19 NOTE — Telephone Encounter (Signed)
Pt called requesting refill of pain medication. Pain level 3-5. Pt states she is doing well just very sore. Pt taking miralax for constipaition. Voiding well. Pt eating well. No fever. Wds look ok per pt. Pt declined tramadol. Pt states this does not work for her that is why MD gave her oxycodone. Pt advised the request will be sent to Dr Dalbert Batman for review. Pt can be reached at [phone].

## 2013-10-02 ENCOUNTER — Encounter (HOSPITAL_COMMUNITY): Payer: Self-pay | Admitting: Emergency Medicine

## 2013-10-02 ENCOUNTER — Emergency Department (HOSPITAL_COMMUNITY)
Admission: EM | Admit: 2013-10-02 | Discharge: 2013-10-02 | Discharge status: Against Medical Advice | Payer: Medicaid Other | Attending: Emergency Medicine | Admitting: Emergency Medicine

## 2013-10-02 DIAGNOSIS — I252 Old myocardial infarction: Secondary | ICD-10-CM | POA: Diagnosis not present

## 2013-10-02 DIAGNOSIS — G8918 Other acute postprocedural pain: Secondary | ICD-10-CM | POA: Insufficient documentation

## 2013-10-02 DIAGNOSIS — I10 Essential (primary) hypertension: Secondary | ICD-10-CM | POA: Diagnosis not present

## 2013-10-02 DIAGNOSIS — R109 Unspecified abdominal pain: Secondary | ICD-10-CM | POA: Insufficient documentation

## 2013-10-02 DIAGNOSIS — I509 Heart failure, unspecified: Secondary | ICD-10-CM | POA: Diagnosis not present

## 2013-10-02 DIAGNOSIS — R110 Nausea: Secondary | ICD-10-CM | POA: Diagnosis not present

## 2013-10-02 DIAGNOSIS — I209 Angina pectoris, unspecified: Secondary | ICD-10-CM | POA: Insufficient documentation

## 2013-10-02 DIAGNOSIS — F17200 Nicotine dependence, unspecified, uncomplicated: Secondary | ICD-10-CM | POA: Diagnosis not present

## 2013-10-02 DIAGNOSIS — I2510 Atherosclerotic heart disease of native coronary artery without angina pectoris: Secondary | ICD-10-CM | POA: Insufficient documentation

## 2013-10-02 DIAGNOSIS — F1010 Alcohol abuse, uncomplicated: Secondary | ICD-10-CM | POA: Insufficient documentation

## 2013-10-02 NOTE — ED Notes (Signed)
Pt had gallbladder surgery two weeks ago, yesterday when she put her seatbelt on she felt something "pull" in the incision area, she also states that she decided to drink ETOH today

## 2013-10-02 NOTE — ED Notes (Signed)
Pt got up from waiting area with her family and walked out of the ER, pt stated she was "going home"

## 2013-10-17 ENCOUNTER — Encounter (INDEPENDENT_AMBULATORY_CARE_PROVIDER_SITE_OTHER): Payer: Self-pay | Admitting: General Surgery

## 2013-10-17 ENCOUNTER — Other Ambulatory Visit (INDEPENDENT_AMBULATORY_CARE_PROVIDER_SITE_OTHER): Payer: Self-pay | Admitting: General Surgery

## 2013-10-17 ENCOUNTER — Ambulatory Visit (INDEPENDENT_AMBULATORY_CARE_PROVIDER_SITE_OTHER): Payer: Medicaid Other | Admitting: General Surgery

## 2013-10-17 VITALS — BP 129/87 | HR 83 | Temp 99.1°F | Resp 18 | Ht 62.0 in | Wt 231.6 lb

## 2013-10-17 DIAGNOSIS — E279 Disorder of adrenal gland, unspecified: Secondary | ICD-10-CM

## 2013-10-17 DIAGNOSIS — E278 Other specified disorders of adrenal gland: Secondary | ICD-10-CM

## 2013-10-17 DIAGNOSIS — K8071 Calculus of gallbladder and bile duct without cholecystitis with obstruction: Secondary | ICD-10-CM

## 2013-10-17 DIAGNOSIS — K8000 Calculus of gallbladder with acute cholecystitis without obstruction: Secondary | ICD-10-CM

## 2013-10-17 NOTE — Patient Instructions (Signed)
You have recovered from your gallbladder surgery without any obvious surgical complications.  You may resume normal physical activities. Low-fat diet is advised.  The CT scan that was performed at [facility] reportedly shows benign appearing bilateral adrenal nodules. These need to be followed up.  Since you do not have a primary care physician, return to see Dr. Dalbert Batman in 6 months. We will schedule a CT scan of your abdomen and pelvis prior to that office visit to make sure that the adrenal nodules have not enlarged.

## 2013-10-17 NOTE — Progress Notes (Signed)
Patient ID: Christine Davidson, female   DOB: 28-Jul-1970, 43 y.o.   MRN: [id number]  History: This patient developed acute cholecystitis with cholelithiasis back in June. She initially presented to [facility] where a CT scan showed gallstones and apparently also showed benign-appearing bilateral adrenal nodules. She was discharged home. [facility] it was then transferred to [place] on EGD on 09/13/2013. She was admitted and I performed a laparoscopic cholecystectomy with cholangiogram on 09/14/2013.Final pathology report shows chronic cholecystitis with cholelithiasis. She states that she has done well. She is tolerating diet. Having bowel movements. She states that she is totally disabled because of psychiatric reasons. She states that she fired her PCP and [facility] and needs to get another one. She takes Xanax, aspirin, ProSom, lisinopril, metoprolol, Remeron  Exam: Patient is in no distress. Cooperative.Somewhat flattened affect. Abdomen soft. Nontender. All trocar sites well-healed. No hernias or infection  Assessment: Acute and chronic cholecystitis with cholelithiasis, uneventful recovery following laparoscopic cholecystectomy with cholangiogram Benign-appearing bilateral adrenal nodules according to CT scan done at [facility]  Plan: Resume normal activities without restriction Low fat diet I told her she needed to CT scan in 6 wants to insure stability of her adrenal nodules. She asked if I would see her at that time and I agreed. We will schedule a CT scan and followup visit in 6 months  since she has no other physicians.   Edsel Petrin. Dalbert Batman, M.D., [facility] General and Minimally invasive Surgery Breast and Colorectal Surgery Office:   [phone] Pager:   [phone]

## 2013-12-06 ENCOUNTER — Other Ambulatory Visit (HOSPITAL_BASED_OUTPATIENT_CLINIC_OR_DEPARTMENT_OTHER): Payer: Self-pay | Admitting: Internal Medicine

## 2013-12-06 DIAGNOSIS — Z1231 Encounter for screening mammogram for malignant neoplasm of breast: Secondary | ICD-10-CM

## 2014-04-05 ENCOUNTER — Inpatient Hospital Stay: Admission: RE | Admit: 2014-04-05 | Payer: Self-pay | Source: Ambulatory Visit

## 2015-04-10 ENCOUNTER — Emergency Department (HOSPITAL_COMMUNITY): Payer: Medicaid Other

## 2015-04-10 ENCOUNTER — Encounter (HOSPITAL_COMMUNITY): Payer: Self-pay | Admitting: Emergency Medicine

## 2015-04-10 ENCOUNTER — Observation Stay (HOSPITAL_COMMUNITY)
Admission: EM | Admit: 2015-04-10 | Discharge: 2015-04-11 | Disposition: A | Discharge status: Home / Self Care | Payer: Medicaid Other | Attending: Family Medicine | Admitting: Family Medicine

## 2015-04-10 DIAGNOSIS — B1920 Unspecified viral hepatitis C without hepatic coma: Secondary | ICD-10-CM | POA: Diagnosis not present

## 2015-04-10 DIAGNOSIS — I252 Old myocardial infarction: Secondary | ICD-10-CM | POA: Diagnosis not present

## 2015-04-10 DIAGNOSIS — E669 Obesity, unspecified: Secondary | ICD-10-CM

## 2015-04-10 DIAGNOSIS — Z720 Tobacco use: Secondary | ICD-10-CM | POA: Diagnosis present

## 2015-04-10 DIAGNOSIS — Z885 Allergy status to narcotic agent status: Secondary | ICD-10-CM | POA: Insufficient documentation

## 2015-04-10 DIAGNOSIS — Z880 Allergy status to penicillin: Secondary | ICD-10-CM | POA: Diagnosis not present

## 2015-04-10 DIAGNOSIS — E119 Type 2 diabetes mellitus without complications: Secondary | ICD-10-CM | POA: Insufficient documentation

## 2015-04-10 DIAGNOSIS — I2510 Atherosclerotic heart disease of native coronary artery without angina pectoris: Secondary | ICD-10-CM | POA: Insufficient documentation

## 2015-04-10 DIAGNOSIS — R079 Chest pain, unspecified: Secondary | ICD-10-CM | POA: Diagnosis not present

## 2015-04-10 DIAGNOSIS — Z955 Presence of coronary angioplasty implant and graft: Secondary | ICD-10-CM | POA: Insufficient documentation

## 2015-04-10 DIAGNOSIS — I10 Essential (primary) hypertension: Secondary | ICD-10-CM | POA: Insufficient documentation

## 2015-04-10 DIAGNOSIS — C7490 Malignant neoplasm of unspecified part of unspecified adrenal gland: Secondary | ICD-10-CM | POA: Insufficient documentation

## 2015-04-10 DIAGNOSIS — R072 Precordial pain: Secondary | ICD-10-CM

## 2015-04-10 DIAGNOSIS — R112 Nausea with vomiting, unspecified: Secondary | ICD-10-CM | POA: Insufficient documentation

## 2015-04-10 DIAGNOSIS — Z9861 Coronary angioplasty status: Secondary | ICD-10-CM

## 2015-04-10 DIAGNOSIS — Z7982 Long term (current) use of aspirin: Secondary | ICD-10-CM | POA: Insufficient documentation

## 2015-04-10 DIAGNOSIS — F17210 Nicotine dependence, cigarettes, uncomplicated: Secondary | ICD-10-CM | POA: Diagnosis not present

## 2015-04-10 DIAGNOSIS — Z6841 Body Mass Index (BMI) 40.0 and over, adult: Secondary | ICD-10-CM | POA: Diagnosis not present

## 2015-04-10 DIAGNOSIS — E1169 Type 2 diabetes mellitus with other specified complication: Secondary | ICD-10-CM

## 2015-04-10 DIAGNOSIS — J449 Chronic obstructive pulmonary disease, unspecified: Secondary | ICD-10-CM | POA: Diagnosis not present

## 2015-04-10 LAB — CBC
HCT: 47.2 % — ABNORMAL HIGH (ref 36.0–46.0)
HEMOGLOBIN: 15.7 g/dL — AB (ref 12.0–15.0)
MCH: 32 pg (ref 26.0–34.0)
MCHC: 33.3 g/dL (ref 30.0–36.0)
MCV: 96.3 fL (ref 78.0–100.0)
Platelets: 216 10*3/uL (ref 150–400)
RBC: 4.9 MIL/uL (ref 3.87–5.11)
RDW: 13.4 % (ref 11.5–15.5)
WBC: 9.7 10*3/uL (ref 4.0–10.5)

## 2015-04-10 LAB — BASIC METABOLIC PANEL
Anion gap: 10 (ref 5–15)
BUN: 7 mg/dL (ref 6–20)
CALCIUM: 9 mg/dL (ref 8.9–10.3)
CO2: 23 mmol/L (ref 22–32)
Chloride: 108 mmol/L (ref 101–111)
Creatinine, Ser: 0.84 mg/dL (ref 0.44–1.00)
GFR calc Af Amer: >60 mL/min (ref >60)
GFR calc non Af Amer: >60 mL/min (ref >60)
GLUCOSE: 94 mg/dL (ref 65–99)
Potassium: 3.8 mmol/L (ref 3.5–5.1)
Sodium: 141 mmol/L (ref 135–145)

## 2015-04-10 LAB — I-STAT TROPONIN, ED: Troponin i, poc: 0 ng/mL (ref 0.00–0.08)

## 2015-04-10 LAB — PROTIME-INR
INR: 1.1 (ref 0.00–1.49)
Prothrombin Time: 14.4 seconds (ref 11.6–15.2)

## 2015-04-10 MED ORDER — HYDROMORPHONE HCL 1 MG/ML IJ SOLN
1.0000 mg | Freq: Once | INTRAMUSCULAR | Status: AC
  Administered 2015-04-10: 1 mg via INTRAVENOUS
  Filled 2015-04-10: qty 1

## 2015-04-10 MED ORDER — ONDANSETRON HCL 4 MG/2ML IJ SOLN
4.0000 mg | Freq: Once | INTRAMUSCULAR | Status: AC
  Administered 2015-04-10: 4 mg via INTRAVENOUS
  Filled 2015-04-10: qty 2

## 2015-04-10 NOTE — ED Notes (Signed)
Pt. reports worsening central chest pain with SOB and nausea onset this evening , pt. received 4 baby ASA and 2 NTG sl with slight relief. Marland Kitchen

## 2015-04-10 NOTE — ED Provider Notes (Signed)
CSN: [id number]     Arrival date & time 04/10/15  2120 History   First MD Initiated Contact with Patient 04/10/15 2138     Chief Complaint  Patient presents with  . Chest Pain     (Consider location/radiation/quality/duration/timing/severity/associated sxs/prior Treatment) HPI   54 y f w PMH CAD, chf, coming in with chest pain that started this evening.  The chest pain is substernal, pressure like, associated with some nausea.  The chest pain started at rest.  She is still having some chest pain but notes that the chest pain has improved somewhat.  She says this chest pain feels like her previous MI's  Past Medical History  Diagnosis Date  . Anxiety   . Cardiac angina ([place])   . Hypertension   . Panic attack   . Bipolar 1 disorder ([place])   . Myocardial infarction [facility])     2005 and 2002  stents placed both times  . Shortness of breath   . Peripheral vascular disease ([place])     pt states both legs  . CHF (congestive heart failure) ([place])     years ago - per pt  . GERD (gastroesophageal reflux disease)     no recent problems-no meds needed  . Arthritis     both wrists  . Pain     left wrist -pt fell sat 03/25/12 and fractured left wrist.  has splint--no other injuries  . Complication of anesthesia     pt states she was told she woke up during some surgeries-but she did not have any recall or awareness  . Coronary artery disease     Dr. Debara Pickett with [facility] is pt's cardiologist  . Depression     pt states "major depression"  . Gallbladder disease    Past Surgical History  Procedure Laterality Date  . Cardiac catheterization    . Wrist surgery    . Cesarean section    . Dental surgery    . Tmj arthroplasty    . Coronary angioplasty      stents  . Open reduction internal fixation (orif) distal radial fracture  03/31/2012    Procedure: OPEN REDUCTION INTERNAL FIXATION (ORIF) DISTAL RADIAL FRACTURE;  Surgeon: Mcarthur Rossetti, MD;  Location: [facility];   Service: Orthopedics;  Laterality: Left;  Open Reduction Internal Fixation left distal radius fracture  . Tubal ligation    . Cholecystectomy N/A 09/14/2013    Procedure: LAPAROSCOPIC CHOLECYSTECTOMY WITH INTRAOPERATIVE CHOLANGIOGRAM;  Surgeon: Adin Hector, MD;  Location: [facility];  Service: General;  Laterality: N/A;   No family history on file. Social History  Substance Use Topics  . Smoking status: Current Every Day Smoker -- 1.00 packs/day for 30 years    Types: Cigarettes  . Smokeless tobacco: Never Used  . Alcohol Use: No   OB History    No data available     Review of Systems  Constitutional: Negative for fever and chills.  HENT: Negative for nosebleeds.   Eyes: Negative for visual disturbance.  Respiratory: Negative for cough and shortness of breath.   Cardiovascular: Positive for chest pain.  Gastrointestinal: Positive for nausea. Negative for vomiting, abdominal pain, diarrhea and constipation.  Genitourinary: Negative for dysuria.  Skin: Negative for rash.  Neurological: Negative for weakness.  All other systems reviewed and are negative.     Allergies  Codeine; Hydrocodone; Morphine and related; Norco; Ciprofloxacin; Penicillins; and Tylenol  Home Medications   Prior to Admission medications  Medication Sig Start Date End Date Taking? Authorizing Provider  alprazolam Duanne Moron) 2 MG tablet Take 2 mg by mouth 2 (two) times daily.   Yes Historical Provider, MD  aspirin 325 MG tablet Take 325 mg by mouth every morning.   Yes Historical Provider, MD  estazolam (PROSOM) 2 MG tablet Take 2 mg by mouth at bedtime.   Yes Historical Provider, MD  eszopiclone (LUNESTA) 1 MG TABS tablet Take 3 mg by mouth at bedtime as needed for sleep. Take immediately before bedtime   Yes Historical Provider, MD  lisinopril (PRINIVIL,ZESTRIL) 40 MG tablet Take 40 mg by mouth daily.   Yes Historical Provider, MD  mirtazapine (REMERON SOL-TAB) 30 MG disintegrating tablet Take 60 mg by  mouth at bedtime.   Yes Historical Provider, MD  nitroGLYCERIN (NITROSTAT) 0.4 MG SL tablet Place 0.4 mg under the tongue every 5 (five) minutes as needed for chest pain.   Yes Historical Provider, MD  promethazine (PHENERGAN) 25 MG tablet Take 25 mg by mouth every 4 (four) hours as needed for nausea or vomiting.   Yes Historical Provider, MD   BP 103/61 mmHg  Pulse 95  Resp 14  SpO2 99% Physical Exam  Constitutional: She is oriented to person, place, and time. No distress.  HENT:  Head: Normocephalic and atraumatic.  Eyes: EOM are normal. Pupils are equal, round, and reactive to light.  Neck: Normal range of motion. Neck supple.  Cardiovascular: Normal rate and intact distal pulses.   Pulmonary/Chest: No respiratory distress.  Abdominal: Soft. There is no tenderness.  Musculoskeletal: Normal range of motion.  Neurological: She is alert and oriented to person, place, and time.  Skin: No rash noted. She is not diaphoretic.  Psychiatric: She has a normal mood and affect.    ED Course  Procedures (including critical care time) Labs Review Labs Reviewed  CBC - Abnormal; Notable for the following:    Hemoglobin 15.7 (*)    HCT 47.2 (*)    All other components within normal limits  BASIC METABOLIC PANEL  PROTIME-INR  BRAIN NATRIURETIC PEPTIDE  I-STAT TROPOININ, ED    Imaging Review Dg Chest 2 View  04/10/2015  CLINICAL DATA:  Acute onset of generalized chest pain, nausea and vomiting. Shortness of breath. Initial encounter. EXAM: CHEST  2 VIEW COMPARISON:  Chest radiograph performed 11/08/2011 FINDINGS: The lungs are well-aerated. Mild vascular congestion is noted. Minimal bibasilar atelectasis is noted. There is no evidence of focal opacification, pleural effusion or pneumothorax. The heart is borderline normal in size. No acute osseous abnormalities are seen. IMPRESSION: Minimal bibasilar atelectasis noted. Lungs otherwise clear. Mild vascular congestion noted. Electronically  Signed   By: Garald Balding M.D.   On: 04/10/2015 22:09   I have personally reviewed and evaluated these images and lab results as part of my medical decision-making.   EKG Interpretation None      MDM   Final diagnoses:  Precordial chest pain    18 y f w PMH CAD, chf, coming in with chest pain that started this evening that feels like her typical angina. Her pain has been improving and she is almost chest pain free at this time  Exam as above  Will obtain ekg,cbc/bmp/trop  ekg w/o ischemic changes.  Cbc/bmp unremarkable.  Trop neg.  Patient got asa with EMS Doubt dissection given no radiation to the back and patient not hypertensive.  Doubt pe given no tachy, no hypoxia.  Will admit for chest pain rule out.  Jarome Matin, MD 04/11/15 [id number]  Charlesetta Shanks, MD 04/14/15 1124

## 2015-04-11 DIAGNOSIS — E669 Obesity, unspecified: Secondary | ICD-10-CM

## 2015-04-11 DIAGNOSIS — I2510 Atherosclerotic heart disease of native coronary artery without angina pectoris: Secondary | ICD-10-CM | POA: Diagnosis not present

## 2015-04-11 DIAGNOSIS — I10 Essential (primary) hypertension: Secondary | ICD-10-CM | POA: Diagnosis not present

## 2015-04-11 DIAGNOSIS — R079 Chest pain, unspecified: Secondary | ICD-10-CM

## 2015-04-11 DIAGNOSIS — Z720 Tobacco use: Secondary | ICD-10-CM | POA: Diagnosis not present

## 2015-04-11 DIAGNOSIS — E119 Type 2 diabetes mellitus without complications: Secondary | ICD-10-CM | POA: Diagnosis not present

## 2015-04-11 DIAGNOSIS — Z9861 Coronary angioplasty status: Secondary | ICD-10-CM

## 2015-04-11 DIAGNOSIS — Z955 Presence of coronary angioplasty implant and graft: Secondary | ICD-10-CM | POA: Diagnosis not present

## 2015-04-11 DIAGNOSIS — E1169 Type 2 diabetes mellitus with other specified complication: Secondary | ICD-10-CM

## 2015-04-11 LAB — GLUCOSE, CAPILLARY
GLUCOSE-CAPILLARY: 108 mg/dL — AB (ref 65–99)
GLUCOSE-CAPILLARY: 104 mg/dL — AB (ref 65–99)
Glucose-Capillary: 91 mg/dL (ref 65–99)
Glucose-Capillary: 80 mg/dL (ref 65–99)

## 2015-04-11 LAB — TROPONIN I
Troponin I: <0.03 ng/mL (ref <0.031)
Troponin I: <0.03 ng/mL (ref <0.031)

## 2015-04-11 LAB — BRAIN NATRIURETIC PEPTIDE: B Natriuretic Peptide: 78.6 pg/mL (ref 0.0–100.0)

## 2015-04-11 MED ORDER — PROMETHAZINE HCL 25 MG PO TABS: 25.0000 mg | ORAL_TABLET | ORAL | Status: DC | PRN

## 2015-04-11 MED ORDER — NICOTINE 21 MG/24HR TD PT24
21.0000 mg | MEDICATED_PATCH | Freq: Every day | TRANSDERMAL | Status: DC
  Administered 2015-04-11: 21 mg via TRANSDERMAL
  Filled 2015-04-11: qty 1

## 2015-04-11 MED ORDER — ASPIRIN EC 81 MG PO TBEC
81.0000 mg | DELAYED_RELEASE_TABLET | Freq: Every day | ORAL | Status: DC
  Administered 2015-04-11: 81 mg via ORAL
  Filled 2015-04-11: qty 1

## 2015-04-11 MED ORDER — ALPRAZOLAM 0.5 MG PO TABS
2.0000 mg | ORAL_TABLET | Freq: Two times a day (BID) | ORAL | Status: DC
  Administered 2015-04-11 (×2): 2 mg via ORAL
  Filled 2015-04-11 (×2): qty 4

## 2015-04-11 MED ORDER — LISINOPRIL 40 MG PO TABS
40.0000 mg | ORAL_TABLET | Freq: Every day | ORAL | Status: DC
  Filled 2015-04-11: qty 1

## 2015-04-11 MED ORDER — TEMAZEPAM 15 MG PO CAPS: 30.0000 mg | ORAL_CAPSULE | Freq: Every evening | ORAL | Status: DC | PRN

## 2015-04-11 MED ORDER — IPRATROPIUM-ALBUTEROL 0.5-2.5 (3) MG/3ML IN SOLN: 3.0000 mL | RESPIRATORY_TRACT | Status: DC | PRN

## 2015-04-11 MED ORDER — NITROGLYCERIN 0.4 MG SL SUBL: 0.4000 mg | SUBLINGUAL_TABLET | SUBLINGUAL | Status: DC | PRN

## 2015-04-11 MED ORDER — ASPIRIN 325 MG PO TABS: 325.0000 mg | ORAL_TABLET | Freq: Every day | ORAL | Status: DC

## 2015-04-11 MED ORDER — ACETAMINOPHEN 325 MG PO TABS: 650.0000 mg | ORAL_TABLET | ORAL | Status: DC | PRN

## 2015-04-11 MED ORDER — ZOLPIDEM TARTRATE 5 MG PO TABS: 5.0000 mg | ORAL_TABLET | Freq: Every evening | ORAL | Status: DC | PRN

## 2015-04-11 MED ORDER — ONDANSETRON HCL 4 MG/2ML IJ SOLN
4.0000 mg | Freq: Four times a day (QID) | INTRAMUSCULAR | Status: DC | PRN
  Administered 2015-04-11: 4 mg via INTRAVENOUS
  Filled 2015-04-11: qty 2

## 2015-04-11 MED ORDER — ENOXAPARIN SODIUM 40 MG/0.4ML ~~LOC~~ SOLN
40.0000 mg | Freq: Every day | SUBCUTANEOUS | Status: DC
  Administered 2015-04-11: 40 mg via SUBCUTANEOUS
  Filled 2015-04-11: qty 0.4

## 2015-04-11 MED ORDER — INSULIN ASPART 100 UNIT/ML ~~LOC~~ SOLN: 0.0000 [IU] | SUBCUTANEOUS | Status: DC

## 2015-04-11 MED ORDER — TICAGRELOR 90 MG PO TABS
90.0000 mg | ORAL_TABLET | Freq: Two times a day (BID) | ORAL | Status: DC
  Administered 2015-04-11 (×2): 90 mg via ORAL
  Filled 2015-04-11: qty 1

## 2015-04-11 MED ORDER — GI COCKTAIL ~~LOC~~
30.0000 mL | Freq: Four times a day (QID) | ORAL | Status: DC | PRN
  Administered 2015-04-11: 30 mL via ORAL
  Filled 2015-04-11: qty 30

## 2015-04-11 MED ORDER — MIRTAZAPINE 30 MG PO TBDP
60.0000 mg | ORAL_TABLET | Freq: Every day | ORAL | Status: DC
  Administered 2015-04-11: 60 mg via ORAL
  Filled 2015-04-11 (×2): qty 2

## 2015-04-11 NOTE — Progress Notes (Signed)
Patient received into 2w12 alert and oriented and verbal. On arrival, patient ambulated to the bed without any problem. Safety measures put in placed and cardiac monitor placed and confirmed. Oriented patient to the room and call bell placed within reach. Will keep monitoring.

## 2015-04-11 NOTE — ED Notes (Signed)
Attempted to call report

## 2015-04-11 NOTE — Consult Note (Signed)
Primary Physician: Primary Cardiologist:  Asked to see for CP  HPI:  Pt was previously followed in cardiology by Dr Olevia Perches and Dr Albertine Patricia then hilty  Last seen several years ago  Last heart cath in 2007   LM normal  LAD 20 to 30% LCx nondominant  Stent large and patent  OM 1 patent;  RCA dominant  Overlapping stents (from 2005) patent.  LVEF 55%   Pt was admitted to [facility] this summer with STEMI  Underwent Cath/ PCI/DES to an OM   Recomm DAPT x 1 year at least  Moderate dz in RCA noted.   Since d/c she says that she had been feeling good until last night Yesterday she was having argument with someone  She was stressed  Noted CP under L breast.   Not anything as bad as in July  EMS came  Gave NTG and ASA  Pt improved  Gradually eased off.  Current pain free  Feeling OK  Walked some with no problem    Pt also has a history of COPD, DM, adrenal gland CA, Hep C Continues to smoke 1-2 to 1 ppd     Past Medical History  Diagnosis Date  . Anxiety   . Cardiac angina ([place])   . Hypertension   . Panic attack   . Bipolar 1 disorder ([place])   . Myocardial infarction [facility])     2005 and 2002  stents placed both times  . Shortness of breath   . Peripheral vascular disease ([place])     pt states both legs  . CHF (congestive heart failure) ([place])     years ago - per pt  . GERD (gastroesophageal reflux disease)     no recent problems-no meds needed  . Arthritis     both wrists  . Pain     left wrist -pt fell sat 03/25/12 and fractured left wrist.  has splint--no other injuries  . Complication of anesthesia     pt states she was told she woke up during some surgeries-but she did not have any recall or awareness  . Coronary artery disease     Dr. Debara Pickett with [facility] is pt's cardiologist  . Depression     pt states "major depression"  . Gallbladder disease     Medications Prior to Admission  Medication Sig Dispense Refill  . albuterol (PROVENTIL HFA;VENTOLIN HFA) 108  (90 Base) MCG/ACT inhaler Inhale 2 puffs into the lungs 4 (four) times daily.    Marland Kitchen alprazolam (XANAX) 2 MG tablet Take 2 mg by mouth 2 (two) times daily.    . ARIPiprazole (ABILIFY) 15 MG tablet Take 7.5-15 mg by mouth daily.    Marland Kitchen aspirin 325 MG tablet Take 325 mg by mouth every morning.    Marland Kitchen atorvastatin (LIPITOR) 80 MG tablet Take 80 mg by mouth daily.    . carvedilol (COREG) 12.5 MG tablet Take 12.5 mg by mouth 2 (two) times daily.    Marland Kitchen estazolam (PROSOM) 2 MG tablet Take 2 mg by mouth at bedtime.    . eszopiclone (LUNESTA) 1 MG TABS tablet Take 3 mg by mouth at bedtime as needed for sleep. Take immediately before bedtime    . Eszopiclone 3 MG TABS Take 3 mg by mouth at bedtime.    . furosemide (LASIX) 20 MG tablet Take 20 mg by mouth daily.    Marland Kitchen glipiZIDE (GLUCOTROL) 5 MG tablet Take 5 mg by mouth daily.    Marland Kitchen  lisinopril (PRINIVIL,ZESTRIL) 40 MG tablet Take 40 mg by mouth daily.    . mirtazapine (REMERON SOL-TAB) 30 MG disintegrating tablet Take 60 mg by mouth at bedtime.    . montelukast (SINGULAIR) 10 MG tablet Take 10 mg by mouth every evening.    . nitroGLYCERIN (NITROSTAT) 0.4 MG SL tablet Place 0.4 mg under the tongue every 5 (five) minutes as needed for chest pain.    . nitroGLYCERIN (NITROSTAT) 0.4 MG SL tablet Place 0.4 mg under the tongue every 5 (five) minutes as needed. Chest pain    . promethazine (PHENERGAN) 25 MG tablet Take 25 mg by mouth every 4 (four) hours as needed for nausea or vomiting.    . ticagrelor (BRILINTA) 90 MG TABS tablet Take 90 mg by mouth 2 (two) times daily.       Marland Kitchen alprazolam  2 mg Oral BID  . aspirin EC  81 mg Oral Daily  . enoxaparin (LOVENOX) injection  40 mg Subcutaneous Daily  . insulin aspart  0-9 Units Subcutaneous 6 times per day  . lisinopril  40 mg Oral Daily  . mirtazapine  60 mg Oral QHS  . nicotine  21 mg Transdermal Daily  . ticagrelor  90 mg Oral BID    Infusions:    Allergies  Allergen Reactions  . Buprenorphine Hcl Hives and  Swelling    REACTION: Swelling, hives around injection site. Swelling of throat  . Codeine Hives and Swelling    Swelling of throat  . Hydrocodone Hives and Swelling    REACTION: Swelling, hives around injection site. Swelling of throat  . Morphine And Related Hives and Swelling    REACTION: Swelling, hives around injection site. Swelling of throat  . Norco [Hydrocodone-Acetaminophen] Hives and Swelling    Hives, swelling of throat  . Ciprofloxacin Nausea And Vomiting  . Penicillins Other (See Comments)    REACTION: Childhood allergy  . Tylenol [Acetaminophen] Nausea And Vomiting and Other (See Comments)    REACTION: Upsets stomach/causes pain    Social History   Social History  . Marital Status: Single    Spouse Name: N/A  . Number of Children: N/A  . Years of Education: N/A   Occupational History  . Not on file.   Social History Main Topics  . Smoking status: Current Every Day Smoker -- 1.00 packs/day for 30 years    Types: Cigarettes  . Smokeless tobacco: Never Used  . Alcohol Use: No  . Drug Use: Yes    Special: Marijuana     Comment: daily use  . Sexual Activity: Yes    Birth Control/ Protection: Surgical   Other Topics Concern  . Not on file   Social History Narrative    No family history on file.  REVIEW OF SYSTEMS:  All systems reviewed  Negative to the above problem except as noted above.    PHYSICAL EXAM: Filed Vitals:   04/11/15 0807 04/11/15 1209  BP: 108/42 107/52  Pulse: 81 82  Temp: 98.3 F (36.8 C) 98.3 F (36.8 C)  Resp: 18 18    No intake or output data in the 24 hours ending 04/11/15 1257  General: 45 yo morbidly obese female in NAD  No  respiratory difficulty HEENT: normal Neck: supple. no JVD. Carotids 2+ bilat; no bruits. No lymphadenopathy or thryomegaly appreciated. Cor: PMI nondisplaced. Regular rate & rhythm. No rubs, gallops or murmurs. Lungs: rhonchi clear with cough   Abdomen: soft, nontender, nondistended. No  hepatosplenomegaly. No bruits or  masses. Good bowel sounds. Extremities: no cyanosis, clubbing, rash, edema Neuro: alert & oriented x 3, cranial nerves grossly intact. moves all 4 extremities w/o difficulty. Affect pleasant.  ECG:  SR 78 bpm  IWMI    Results for orders placed or performed during the hospital encounter of 04/10/15 (from the past 24 hour(s))  Basic metabolic panel     Status: None   Collection Time: 04/10/15 10:04 PM  Result Value Ref Range   Sodium 141 135 - 145 mmol/L   Potassium 3.8 3.5 - 5.1 mmol/L   Chloride 108 101 - 111 mmol/L   CO2 23 22 - 32 mmol/L   Glucose, Bld 94 65 - 99 mg/dL   BUN 7 6 - 20 mg/dL   Creatinine, Ser 0.84 0.44 - 1.00 mg/dL   Calcium 9.0 8.9 - 10.3 mg/dL   GFR calc non Af Amer >60 >60 mL/min   GFR calc Af Amer >60 >60 mL/min   Anion gap 10 5 - 15  CBC     Status: Abnormal   Collection Time: 04/10/15 10:04 PM  Result Value Ref Range   WBC 9.7 4.0 - 10.5 K/uL   RBC 4.90 3.87 - 5.11 MIL/uL   Hemoglobin 15.7 (H) 12.0 - 15.0 g/dL   HCT 47.2 (H) 36.0 - 46.0 %   MCV 96.3 78.0 - 100.0 fL   MCH 32.0 26.0 - 34.0 pg   MCHC 33.3 30.0 - 36.0 g/dL   RDW 13.4 11.5 - 15.5 %   Platelets 216 150 - 400 K/uL  Protime-INR - (order if Patient is taking Coumadin / Warfarin)     Status: None   Collection Time: 04/10/15 10:04 PM  Result Value Ref Range   Prothrombin Time 14.4 11.6 - 15.2 seconds   INR 1.10 0.00 - 1.49  Brain natriuretic peptide     Status: None   Collection Time: 04/10/15 10:04 PM  Result Value Ref Range   B Natriuretic Peptide 78.6 0.0 - 100.0 pg/mL  I-Stat Troponin, ED (not at [facility])     Status: None   Collection Time: 04/10/15 10:18 PM  Result Value Ref Range   Troponin i, poc 0.00 0.00 - 0.08 ng/mL   Comment 3          Glucose, capillary     Status: Abnormal   Collection Time: 04/11/15  1:44 AM  Result Value Ref Range   Glucose-Capillary 108 (H) 65 - 99 mg/dL  Troponin I-serum (0, 3, 6 hours)     Status: None   Collection Time:  04/11/15  2:45 AM  Result Value Ref Range   Troponin I <0.03 <0.031 ng/mL  Glucose, capillary     Status: None   Collection Time: 04/11/15  4:07 AM  Result Value Ref Range   Glucose-Capillary 91 65 - 99 mg/dL  Troponin I-serum (0, 3, 6 hours)     Status: None   Collection Time: 04/11/15  5:08 AM  Result Value Ref Range   Troponin I <0.03 <0.031 ng/mL  Glucose, capillary     Status: Abnormal   Collection Time: 04/11/15  8:05 AM  Result Value Ref Range   Glucose-Capillary 104 (H) 65 - 99 mg/dL   Comment 1 Notify RN    Comment 2 Document in Chart   Troponin I-serum (0, 3, 6 hours)     Status: None   Collection Time: 04/11/15  9:33 AM  Result Value Ref Range   Troponin I <0.03 <0.031 ng/mL  Glucose, capillary  Status: None   Collection Time: 04/11/15 12:14 PM  Result Value Ref Range   Glucose-Capillary 80 65 - 99 mg/dL   Dg Chest 2 View  04/10/2015  CLINICAL DATA:  Acute onset of generalized chest pain, nausea and vomiting. Shortness of breath. Initial encounter. EXAM: CHEST  2 VIEW COMPARISON:  Chest radiograph performed 11/08/2011 FINDINGS: The lungs are well-aerated. Mild vascular congestion is noted. Minimal bibasilar atelectasis is noted. There is no evidence of focal opacification, pleural effusion or pneumothorax. The heart is borderline normal in size. No acute osseous abnormalities are seen. IMPRESSION: Minimal bibasilar atelectasis noted. Lungs otherwise clear. Mild vascular congestion noted. Electronically Signed   By: Garald Balding M.D.   On: 04/10/2015 22:09     ASSESSMENT: CP  Pt is a 45 yo with CP  Known CAD  I do not thnk spell last night/today represents coronary ischemia  EKG neg  Enzymes negative  I would keep on medical Rx including DAPT.   Pt should f/u at [facility] with her cardiologist there  2  HL  Continue meds  3.  Tob  COunselled on cessation    OK to d/c from cardiac standpoint.

## 2015-04-11 NOTE — Discharge Summary (Signed)
Physician Discharge Summary  Christine Davidson [id number] DOB: [birth year] DOA: 04/10/2015  PCP: Benito Mccreedy, MD  Admit date: 04/10/2015 Discharge date: 04/11/2015  Time spent: 25 minutes  Recommendations for Outpatient Follow-up:  needs OP cardiology and pschiatry follow up  Discharge Diagnoses:  Principal Problem:   Chest pain Active Problems:   Hypertension   CAD S/P percutaneous coronary angioplasty   Diabetes mellitus type 2 in obese [facility])   Tobacco abuse   Discharge Condition:   Diet recommendation: hh dm  Filed Weights   04/11/15 0047  Weight: 102.921 kg (226 lb 14.4 oz)    History of present illness:  45 y/o ? pror lap chole 08/2013 L distal radial # s/p repair 03/2012 CAD s/p BMS circ 12/2010 + RCA 11/2003 At [facility] had acute ST elevation MI involving the oblique marginal on 10/07/2014. She was taken to the catheter lab where a drug-eluting stent was placed following angioplasty. Dual antiplatelets therapy for at least 1 year. Also noted were moderate disease of the RCA Still smoker dm ty II Adrenal cancer Hep C  Cardiology consulted troponins x 3 neg EKG done day after admission showed no specific change.  Was cp free. Cardiology felt patient reasonably stable for d/c wihout further plan for further isch work-up and patient d/c home     Discharge Exam: Filed Vitals:   04/11/15 0807 04/11/15 1209  BP: 108/42 107/52  Pulse: 81 82  Temp: 98.3 F (36.8 C) 98.3 F (36.8 C)  Resp: 18 18    General: sleepy but awakens No current cp No n/v Cardiovascular: s1 s2 no m/r/g Respiratory: clear  Discharge Instructions   Discharge Instructions    Diet - low sodium heart healthy    Complete by:  As directed      Discharge instructions    Complete by:  As directed   Take your medications as scheduled Try to avoid stress Follow up with your regular cardiologist     Increase activity slowly    Complete by:  As  directed           Current Discharge Medication List    CONTINUE these medications which have NOT CHANGED   Details  albuterol (PROVENTIL HFA;VENTOLIN HFA) 108 (90 Base) MCG/ACT inhaler Inhale 2 puffs into the lungs 4 (four) times daily.    alprazolam (XANAX) 2 MG tablet Take 2 mg by mouth 2 (two) times daily.    ARIPiprazole (ABILIFY) 15 MG tablet Take 7.5-15 mg by mouth daily.    aspirin 325 MG tablet Take 325 mg by mouth every morning.    atorvastatin (LIPITOR) 80 MG tablet Take 80 mg by mouth daily.    carvedilol (COREG) 12.5 MG tablet Take 12.5 mg by mouth 2 (two) times daily.    estazolam (PROSOM) 2 MG tablet Take 2 mg by mouth at bedtime.    Eszopiclone 3 MG TABS Take 3 mg by mouth at bedtime.    furosemide (LASIX) 20 MG tablet Take 20 mg by mouth daily.    glipiZIDE (GLUCOTROL) 5 MG tablet Take 5 mg by mouth daily.    lisinopril (PRINIVIL,ZESTRIL) 40 MG tablet Take 40 mg by mouth daily.    mirtazapine (REMERON SOL-TAB) 30 MG disintegrating tablet Take 60 mg by mouth at bedtime.    montelukast (SINGULAIR) 10 MG tablet Take 10 mg by mouth every evening.    nitroGLYCERIN (NITROSTAT) 0.4 MG SL tablet Place 0.4 mg under the tongue every 5 (five) minutes as needed for chest pain.  promethazine (PHENERGAN) 25 MG tablet Take 25 mg by mouth every 4 (four) hours as needed for nausea or vomiting.    ticagrelor (BRILINTA) 90 MG TABS tablet Take 90 mg by mouth 2 (two) times daily.       Allergies  Allergen Reactions  . Buprenorphine Hcl Hives and Swelling    REACTION: Swelling, hives around injection site. Swelling of throat  . Codeine Hives and Swelling    Swelling of throat  . Hydrocodone Hives and Swelling    REACTION: Swelling, hives around injection site. Swelling of throat  . Morphine And Related Hives and Swelling    REACTION: Swelling, hives around injection site. Swelling of throat  . Norco [Hydrocodone-Acetaminophen] Hives and Swelling    Hives, swelling  of throat  . Ciprofloxacin Nausea And Vomiting  . Penicillins Other (See Comments)    REACTION: Childhood allergy  . Tylenol [Acetaminophen] Nausea And Vomiting and Other (See Comments)    REACTION: Upsets stomach/causes pain      The results of significant diagnostics from this hospitalization (including imaging, microbiology, ancillary and laboratory) are listed below for reference.    Significant Diagnostic Studies: Dg Chest 2 View  04/10/2015  CLINICAL DATA:  Acute onset of generalized chest pain, nausea and vomiting. Shortness of breath. Initial encounter. EXAM: CHEST  2 VIEW COMPARISON:  Chest radiograph performed 11/08/2011 FINDINGS: The lungs are well-aerated. Mild vascular congestion is noted. Minimal bibasilar atelectasis is noted. There is no evidence of focal opacification, pleural effusion or pneumothorax. The heart is borderline normal in size. No acute osseous abnormalities are seen. IMPRESSION: Minimal bibasilar atelectasis noted. Lungs otherwise clear. Mild vascular congestion noted. Electronically Signed   By: Garald Balding M.D.   On: 04/10/2015 22:09    Microbiology: No results found for this or any previous visit (from the past 240 hour(s)).   Labs: Basic Metabolic Panel:  Recent Labs Lab 04/10/15 2204  NA 141  K 3.8  CL 108  CO2 23  GLUCOSE 94  BUN 7  CREATININE 0.84  CALCIUM 9.0   Liver Function Tests: No results for input(s): AST, ALT, ALKPHOS, BILITOT, PROT, ALBUMIN in the last 168 hours. No results for input(s): LIPASE, AMYLASE in the last 168 hours. No results for input(s): AMMONIA in the last 168 hours. CBC:  Recent Labs Lab 04/10/15 2204  WBC 9.7  HGB 15.7*  HCT 47.2*  MCV 96.3  PLT 216   Cardiac Enzymes:  Recent Labs Lab 04/11/15 0245 04/11/15 0508 04/11/15 0933  TROPONINI <0.03 <0.03 <0.03   BNP: BNP (last 3 results)  Recent Labs  04/10/15 2204  BNP 78.6    ProBNP (last 3 results) No results for input(s): PROBNP in  the last 8760 hours.  CBG:  Recent Labs Lab 04/11/15 0144 04/11/15 0407 04/11/15 0805 04/11/15 1214  GLUCAP 108* 91 104* 80       Signed:  Nita Sells MD   [facility] 04/11/2015, 1:18 PM

## 2015-04-11 NOTE — Care Management Obs Status (Signed)
[place] NOTIFICATION   Patient Details  Name: Christine Davidson MRN: [id number] Date of Birth: 1970/07/18   Medicare Observation Status Notification Given:  No (<24 hours)    Maryclare Labrador, RN 04/11/2015, 1:41 PM

## 2015-04-11 NOTE — Progress Notes (Signed)
Received phone call from "friend of pt" who stated that pt is an elicit drug user, Marijuana being her drug of choice. Also was told that pt used Marijuana in shift report. Notified Dr. Verlon Au in progressive rounds. Vicente Males Therapist, sports

## 2015-04-11 NOTE — H&P (Signed)
[facility] History and Physical  Christine Davidson [id number] DOB: 08-12-1970 DOA: 04/10/2015  Referring physician: ED PCP: Benito Mccreedy, MD   Chief Complaint: Chest pain  HPI:  Christine Davidson is a 45 year old female with a complicated past medical history including coronary artery disease s/p stents last 09/2014, COPD, diabetes mellitus, tobacco abuse, adrenal gland cancer being monitored by endocrinologist, and hepatitis C; who presents with complaints of acute chest pain. Symptoms started approximately 7:30 PM. Patient describes sharp pain underneath her left breast with shortness of breath, diaphoresis, nausea, and vomiting. States symptoms similar to the last 3 previous heart attacks. In route to the emergency department patient was given 324 mg of aspirin and nitroglycerin with subsequent improvement in chest pain. Patient notes that she's on Brilinta to she states that she takes her medications as she is supposed to, but was not able to take her [place] to dose tonight. However, when asking patient for full medication list as it does not match what we have on file she states she is unsure and that she gave her paperwork to EMS. Initial EKG showed sinus rhythm  with premature ventricular complexes, chest x-ray showing mild venous congestion. Initial troponin negative, BNP 78.6.    Review of Systems  Constitutional: Positive for diaphoresis. Negative for chills and weight loss.  HENT: Negative for hearing loss and tinnitus.   Eyes: Negative for double vision and photophobia.  Respiratory: Positive for shortness of breath. Negative for cough and hemoptysis.   Cardiovascular: Positive for chest pain and palpitations. Negative for leg swelling.  Gastrointestinal: Positive for nausea and vomiting.  Genitourinary: Negative for urgency and frequency.  Musculoskeletal: Negative for back pain and joint pain.  Skin: Negative for itching and rash.  Neurological: Negative for tingling and  tremors.  Endo/Heme/Allergies: Negative for environmental allergies. Bruises/bleeds easily.  Psychiatric/Behavioral: The patient has insomnia. The patient is not nervous/anxious.       Past Medical History  Diagnosis Date  . Anxiety   . Cardiac angina ([place])   . Hypertension   . Panic attack   . Bipolar 1 disorder ([place])   . Myocardial infarction [facility])     2005 and 2002  stents placed both times  . Shortness of breath   . Peripheral vascular disease ([place])     pt states both legs  . CHF (congestive heart failure) ([place])     years ago - per pt  . GERD (gastroesophageal reflux disease)     no recent problems-no meds needed  . Arthritis     both wrists  . Pain     left wrist -pt fell sat 03/25/12 and fractured left wrist.  has splint--no other injuries  . Complication of anesthesia     pt states she was told she woke up during some surgeries-but she did not have any recall or awareness  . Coronary artery disease     Dr. Debara Pickett with [facility] is pt's cardiologist  . Depression     pt states "major depression"  . Gallbladder disease      Past Surgical History  Procedure Laterality Date  . Cardiac catheterization    . Wrist surgery    . Cesarean section    . Dental surgery    . Tmj arthroplasty    . Coronary angioplasty      stents  . Open reduction internal fixation (orif) distal radial fracture  03/31/2012    Procedure: OPEN REDUCTION INTERNAL FIXATION (ORIF) DISTAL RADIAL FRACTURE;  Surgeon:  Mcarthur Rossetti, MD;  Location: [facility];  Service: Orthopedics;  Laterality: Left;  Open Reduction Internal Fixation left distal radius fracture  . Tubal ligation    . Cholecystectomy N/A 09/14/2013    Procedure: LAPAROSCOPIC CHOLECYSTECTOMY WITH INTRAOPERATIVE CHOLANGIOGRAM;  Surgeon: Adin Hector, MD;  Location: [facility];  Service: General;  Laterality: N/A;      Social History:  reports that she has been smoking Cigarettes.  She has a 30 pack-year smoking  history. She has never used smokeless tobacco. She reports that she uses illicit drugs (Marijuana). She reports that she does not drink alcohol. Where does patient live--home  Allergies  Allergen Reactions  . Codeine Hives and Swelling    Swelling of throat  . Hydrocodone Hives and Swelling    REACTION: Swelling, hives around injection site. Swelling of throat  . Morphine And Related Hives and Swelling    REACTION: Swelling, hives around injection site. Swelling of throat  . Norco [Hydrocodone-Acetaminophen] Hives and Swelling    Hives, swelling of throat  . Ciprofloxacin Nausea And Vomiting  . Penicillins Other (See Comments)    REACTION: Childhood allergy  . Tylenol [Acetaminophen] Nausea And Vomiting and Other (See Comments)    REACTION: Upsets stomach/causes pain    No family history on file.    Prior to Admission medications   Medication Sig Start Date End Date Taking? Authorizing Provider  alprazolam Duanne Moron) 2 MG tablet Take 2 mg by mouth 2 (two) times daily.   Yes Historical Provider, MD  aspirin 325 MG tablet Take 325 mg by mouth every morning.   Yes Historical Provider, MD  estazolam (PROSOM) 2 MG tablet Take 2 mg by mouth at bedtime.   Yes Historical Provider, MD  eszopiclone (LUNESTA) 1 MG TABS tablet Take 3 mg by mouth at bedtime as needed for sleep. Take immediately before bedtime   Yes Historical Provider, MD  lisinopril (PRINIVIL,ZESTRIL) 40 MG tablet Take 40 mg by mouth daily.   Yes Historical Provider, MD  mirtazapine (REMERON SOL-TAB) 30 MG disintegrating tablet Take 60 mg by mouth at bedtime.   Yes Historical Provider, MD  nitroGLYCERIN (NITROSTAT) 0.4 MG SL tablet Place 0.4 mg under the tongue every 5 (five) minutes as needed for chest pain.   Yes Historical Provider, MD  promethazine (PHENERGAN) 25 MG tablet Take 25 mg by mouth every 4 (four) hours as needed for nausea or vomiting.   Yes Historical Provider, MD     Physical Exam: Filed Vitals:   04/10/15  2200 04/10/15 2202 04/10/15 2230 04/10/15 2300  BP: 124/60 124/60 116/63 103/61  Pulse: 89 90 93 95  Resp:  16 13 14   SpO2: 99% 100% 99% 99%     Constitutional: Vital signs reviewed. Patient is a obese female who appears to be in no acute distress at this time Head: Normocephalic and atraumatic  Ear: TM normal bilaterally  Mouth: no erythema or exudates, MMM  Eyes: PERRL, EOMI, conjunctivae normal, No scleral icterus.  Neck: Supple, Trachea midline normal ROM, No JVD, mass, thyromegaly, or carotid bruit present.  Cardiovascular: RRR, S1 normal, S2 normal, no MRG, pulses symmetric and intact bilaterally, pain nonreproducible with palpation Pulmonary/Chest: Decreased overall aeration with right lung field of rales Abdominal: Soft. Non-tender, non-distended, bowel sounds are normal, no masses, organomegaly, or guarding present.  GU: no CVA tenderness Musculoskeletal: No joint deformities, erythema, or stiffness, ROM full and no nontender Ext: no edema and no cyanosis, pulses palpable bilaterally (DP and PT)  Hematology:  no cervical, inginal, or axillary adenopathy.  Neurological: A&O x3, Strenght is normal and symmetric bilaterally, cranial nerve II-XII are grossly intact, no focal motor deficit, sensory intact to light touch bilaterally.  Skin: Warm, dry and intact. No rash, cyanosis, or clubbing.  Psychiatric: Normal mood and affect. speech and behavior is normal. Judgment and thought content normal. Cognition and memory are normal.      Data Review   Micro Results No results found for this or any previous visit (from the past 240 hour(s)).  Radiology Reports Dg Chest 2 View  04/10/2015  CLINICAL DATA:  Acute onset of generalized chest pain, nausea and vomiting. Shortness of breath. Initial encounter. EXAM: CHEST  2 VIEW COMPARISON:  Chest radiograph performed 11/08/2011 FINDINGS: The lungs are well-aerated. Mild vascular congestion is noted. Minimal bibasilar atelectasis is noted.  There is no evidence of focal opacification, pleural effusion or pneumothorax. The heart is borderline normal in size. No acute osseous abnormalities are seen. IMPRESSION: Minimal bibasilar atelectasis noted. Lungs otherwise clear. Mild vascular congestion noted. Electronically Signed   By: Garald Balding M.D.   On: 04/10/2015 22:09     CBC  Recent Labs Lab 04/10/15 2204  WBC 9.7  HGB 15.7*  HCT 47.2*  PLT 216  MCV 96.3  MCH 32.0  MCHC 33.3  RDW 13.4    Chemistries   Recent Labs Lab 04/10/15 2204  NA 141  K 3.8  CL 108  CO2 23  GLUCOSE 94  BUN 7  CREATININE 0.84  CALCIUM 9.0   ------------------------------------------------------------------------------------------------------------------ CrCl cannot be calculated (Unknown ideal weight.). ------------------------------------------------------------------------------------------------------------------ No results for input(s): HGBA1C in the last 72 hours. ------------------------------------------------------------------------------------------------------------------ No results for input(s): CHOL, HDL, LDLCALC, TRIG, CHOLHDL, LDLDIRECT in the last 72 hours. ------------------------------------------------------------------------------------------------------------------ No results for input(s): TSH, T4TOTAL, T3FREE, THYROIDAB in the last 72 hours.  Invalid input(s): FREET3 ------------------------------------------------------------------------------------------------------------------ No results for input(s): VITAMINB12, FOLATE, FERRITIN, TIBC, IRON, RETICCTPCT in the last 72 hours.  Coagulation profile  Recent Labs Lab 04/10/15 2204  INR 1.10    No results for input(s): DDIMER in the last 72 hours.  Cardiac Enzymes No results for input(s): CKMB, TROPONINI, MYOGLOBIN in the last 168 hours.  Invalid input(s):  CK ------------------------------------------------------------------------------------------------------------------ Invalid input(s): POCBNP   CBG: No results for input(s): GLUCAP in the last 168 hours.     EKG: Independently reviewed. Sinus rhythm with premature ventricular complexes and old posterior infarct.   Assessment/Plan Principal Problem:    Chest pain: Patient reports pain similar to previous heart attacks in the past. Still reports smoking. Heart score is approximately 5. Previous history of MI with last stent placement in 09/2014.  - Admit to a telemetry bed -Patient made NPO  - Repeat EKG at 6 am - Trending cardiac troponins - nitro prn chest pain - Consult cardiology in a.m.   CAD S/P percutaneous coronary angioplasty - Will need to obtain records in a.m.  - Continue Brilintia and aspirin  Hypertension - Pharmacy to reverify patient's medication list  Diabetes mellitus type 2 in obese ([place])   - cbgs every 4 hours with sliding scale insulin for now  Tobacco abuse: Patient reports continued tobacco abuse - NicoDerm patch     Code Status:   full Family Communication: bedside Disposition Plan: admit   Total time spent 55 minutes.Greater than 50% of this time was spent in counseling, explanation of diagnosis, planning of further management, and coordination of care  [facility] Pager [phone]  If 7PM-7AM, please contact  night-coverage [email or web address] Password [email or web address] 04/11/2015, 12:20 AM

## 2015-07-06 ENCOUNTER — Emergency Department (HOSPITAL_COMMUNITY): Payer: Medicaid Other

## 2015-07-06 ENCOUNTER — Encounter (HOSPITAL_COMMUNITY): Payer: Self-pay | Admitting: Emergency Medicine

## 2015-07-06 ENCOUNTER — Observation Stay (HOSPITAL_COMMUNITY)
Admission: EM | Admit: 2015-07-06 | Discharge: 2015-07-06 | Discharge status: Against Medical Advice | Payer: Medicaid Other | Attending: Family Medicine | Admitting: Family Medicine

## 2015-07-06 DIAGNOSIS — I110 Hypertensive heart disease with heart failure: Secondary | ICD-10-CM | POA: Diagnosis not present

## 2015-07-06 DIAGNOSIS — F17210 Nicotine dependence, cigarettes, uncomplicated: Secondary | ICD-10-CM | POA: Diagnosis not present

## 2015-07-06 DIAGNOSIS — W57XXXA Bitten or stung by nonvenomous insect and other nonvenomous arthropods, initial encounter: Secondary | ICD-10-CM

## 2015-07-06 DIAGNOSIS — R079 Chest pain, unspecified: Secondary | ICD-10-CM | POA: Diagnosis not present

## 2015-07-06 DIAGNOSIS — I739 Peripheral vascular disease, unspecified: Secondary | ICD-10-CM | POA: Diagnosis not present

## 2015-07-06 DIAGNOSIS — R197 Diarrhea, unspecified: Secondary | ICD-10-CM

## 2015-07-06 DIAGNOSIS — I252 Old myocardial infarction: Secondary | ICD-10-CM | POA: Diagnosis not present

## 2015-07-06 DIAGNOSIS — Z880 Allergy status to penicillin: Secondary | ICD-10-CM | POA: Insufficient documentation

## 2015-07-06 DIAGNOSIS — I509 Heart failure, unspecified: Secondary | ICD-10-CM | POA: Diagnosis not present

## 2015-07-06 DIAGNOSIS — Z7982 Long term (current) use of aspirin: Secondary | ICD-10-CM | POA: Insufficient documentation

## 2015-07-06 DIAGNOSIS — T148 Other injury of unspecified body region: Secondary | ICD-10-CM | POA: Diagnosis not present

## 2015-07-06 DIAGNOSIS — I2510 Atherosclerotic heart disease of native coronary artery without angina pectoris: Secondary | ICD-10-CM | POA: Diagnosis not present

## 2015-07-06 DIAGNOSIS — A084 Viral intestinal infection, unspecified: Secondary | ICD-10-CM

## 2015-07-06 DIAGNOSIS — R112 Nausea with vomiting, unspecified: Secondary | ICD-10-CM

## 2015-07-06 HISTORY — DX: Unspecified asthma, uncomplicated: J45.909

## 2015-07-06 HISTORY — DX: Malignant (primary) neoplasm, unspecified: C80.1

## 2015-07-06 HISTORY — DX: Type 2 diabetes mellitus without complications: E11.9

## 2015-07-06 LAB — I-STAT TROPONIN, ED: TROPONIN I, POC: 0.01 ng/mL (ref 0.00–0.08)

## 2015-07-06 LAB — BASIC METABOLIC PANEL
ANION GAP: 10 (ref 5–15)
BUN: 8 mg/dL (ref 6–20)
CALCIUM: 9.3 mg/dL (ref 8.9–10.3)
CO2: 21 mmol/L — AB (ref 22–32)
Chloride: 105 mmol/L (ref 101–111)
Creatinine, Ser: 0.77 mg/dL (ref 0.44–1.00)
GFR calc non Af Amer: >60 mL/min (ref >60)
Glucose, Bld: 139 mg/dL — ABNORMAL HIGH (ref 65–99)
Potassium: 4.7 mmol/L (ref 3.5–5.1)
Sodium: 136 mmol/L (ref 135–145)

## 2015-07-06 LAB — CBC
HCT: 54.8 % — ABNORMAL HIGH (ref 36.0–46.0)
Hemoglobin: 18.2 g/dL — ABNORMAL HIGH (ref 12.0–15.0)
MCH: 31.4 pg (ref 26.0–34.0)
MCHC: 33.2 g/dL (ref 30.0–36.0)
MCV: 94.5 fL (ref 78.0–100.0)
PLATELETS: 224 10*3/uL (ref 150–400)
RBC: 5.8 MIL/uL — ABNORMAL HIGH (ref 3.87–5.11)
RDW: 13.5 % (ref 11.5–15.5)
WBC: 13.5 10*3/uL — ABNORMAL HIGH (ref 4.0–10.5)

## 2015-07-06 LAB — PROTIME-INR
INR: 1 (ref 0.00–1.49)
Prothrombin Time: 13.4 seconds (ref 11.6–15.2)

## 2015-07-06 LAB — TROPONIN I

## 2015-07-06 LAB — APTT: aPTT: 24 seconds (ref 24–37)

## 2015-07-06 LAB — LIPASE, BLOOD: LIPASE: 18 U/L (ref 11–51)

## 2015-07-06 MED ORDER — ONDANSETRON HCL 4 MG/2ML IJ SOLN
4.0000 mg | Freq: Once | INTRAMUSCULAR | Status: AC
  Administered 2015-07-06: 4 mg via INTRAVENOUS
  Filled 2015-07-06: qty 2

## 2015-07-06 MED ORDER — FENTANYL CITRATE (PF) 100 MCG/2ML IJ SOLN
50.0000 ug | Freq: Once | INTRAMUSCULAR | Status: AC
  Administered 2015-07-06: 50 ug via INTRAVENOUS
  Filled 2015-07-06: qty 2

## 2015-07-06 MED ORDER — FENTANYL CITRATE (PF) 100 MCG/2ML IJ SOLN
25.0000 ug | Freq: Once | INTRAMUSCULAR | Status: AC
  Administered 2015-07-06: 25 ug via INTRAVENOUS
  Filled 2015-07-06: qty 2

## 2015-07-06 MED ORDER — IOPAMIDOL (ISOVUE-300) INJECTION 61%
INTRAVENOUS | Status: AC
  Administered 2015-07-06: 100 mL
  Filled 2015-07-06: qty 100

## 2015-07-06 MED ORDER — NITROGLYCERIN IN D5W 200-5 MCG/ML-% IV SOLN
5.0000 ug/min | INTRAVENOUS | Status: DC
  Administered 2015-07-06: 5 ug/min via INTRAVENOUS
  Filled 2015-07-06: qty 250

## 2015-07-06 MED ORDER — ASPIRIN 81 MG PO CHEW: 324.0000 mg | CHEWABLE_TABLET | Freq: Once | ORAL | Status: DC

## 2015-07-06 NOTE — Consult Note (Signed)
Medical Consultation   Christine Davidson  [id number]  DOB: June 25, 1970  DOA: 07/06/2015  PCP: Benito Mccreedy, MD  Outpatient Specialists:  Patient Care Team: Benito Mccreedy, MD as PCP - General (Internal Medicine)   Requesting physician: Pryor Curia, MD Reason for consultation: Abdominal Pain    History of Present Illness: Christine Davidson is an 45 y.o. female   with complex medical history including hepatitis C being evaluated for transplant, CHF, CAD, hypertension, peripheral vascular disease, bipolar disorder,initially presenting today with chest pain which begun this morning. The pain was left-sided, under her left breast, non reproducible, initially severe. EMS  Had called prehospital STEMI, based on pain and ST elevation with inferior leads in prehospital EKG. She was given nitroglycerin pain was still 8 out of 10. This was discussed with Dr. Gwenlyn Found, cardiology, who evaluated the EKG, but the findings were not consistent with STEMI, this was canceled. Her chest pain resolved. Shortly after arrival, she began to report abdominal pain, with nausea and vomiting . She also developed loose stools 1, which was significant, but no blood was noted. She reports her daughter had similar symptoms on Thursday, felt to be due to viral gastroenteritis which self resolved by Saturday.Patient reports good appetite, with last meal last night. She denies any food poisoning. On arrival, the patient received IV Zofran, with improvement of her symptoms. Denies any fever, chills or night sweats. Denies shortness of breath. He denies any dizziness or vertigo. She denies any headaches or neck pain, or photophobia.  Denies any myalgias  No recent antibiotics or changes in her. Meds. Pa tient reports a recent tick bite in the right upper back sustained about 3 days ago. [place] and this may not be an isolated incident, she is exposed to several ticks a year. She reports  some petechial rash over the last 3 days in her lower extremities and her arms, but no acute bleeding is noted. She denies any gum, or nosebleeding. She denies any hematuria.  At the ED BP 116/68 mmHg  Pulse 88  Temp(Src) 98.2 F (36.8 C) (Oral)  Resp 27  SpO2 99%. Received hydration, and IV Zofran .EKG was with Sinus rhythm, QTC 471. Tn 0.01. CT abdomen without acute changes   Review of Systems:  As per HPI otherwise 10 point review of systems negative.     Past Medical History: Past Medical History  Diagnosis Date  . Anxiety   . Cardiac angina ([place])   . Hypertension   . Panic attack   . Bipolar 1 disorder ([place])   . Myocardial infarction [facility])     2005 and 2002  stents placed both times  . Shortness of breath   . Peripheral vascular disease ([place])     pt states both legs  . CHF (congestive heart failure) ([place])     years ago - per pt  . GERD (gastroesophageal reflux disease)     no recent problems-no meds needed  . Arthritis     both wrists  . Pain     left wrist -pt fell sat 03/25/12 and fractured left wrist.  has splint--no other injuries  . Complication of anesthesia     pt states she was told she woke up during some surgeries-but she did not have any recall or awareness  . Coronary artery disease     Dr. Debara Pickett with [facility]  is pt's cardiologist  . Depression     pt states "major depression"  . Gallbladder disease   . Asthma   . Cancer ([place])   . Diabetes mellitus without complication [facility])     Past Surgical History: Past Surgical History  Procedure Laterality Date  . Cardiac catheterization    . Wrist surgery    . Cesarean section    . Dental surgery    . Tmj arthroplasty    . Coronary angioplasty      stents  . Open reduction internal fixation (orif) distal radial fracture  03/31/2012    Procedure: OPEN REDUCTION INTERNAL FIXATION (ORIF) DISTAL RADIAL FRACTURE;  Surgeon: Mcarthur Rossetti, MD;  Location: [facility];  Service:  Orthopedics;  Laterality: Left;  Open Reduction Internal Fixation left distal radius fracture  . Tubal ligation    . Cholecystectomy N/A 09/14/2013    Procedure: LAPAROSCOPIC CHOLECYSTECTOMY WITH INTRAOPERATIVE CHOLANGIOGRAM;  Surgeon: Adin Hector, MD;  Location: [facility];  Service: General;  Laterality: N/A;     Allergies:   Allergies  Allergen Reactions  . Buprenorphine Hcl Hives and Swelling    REACTION: Swelling, hives around injection site. Swelling of throat  . Codeine Hives and Swelling    Swelling of throat  . Hydrocodone Hives and Swelling    REACTION: Swelling, hives around injection site. Swelling of throat  . Morphine And Related Hives and Swelling    REACTION: Swelling, hives around injection site. Swelling of throat  . Norco [Hydrocodone-Acetaminophen] Hives and Swelling    Hives, swelling of throat  . Ciprofloxacin Nausea And Vomiting  . Penicillins Other (See Comments)    REACTION: Childhood allergy  . Tylenol [Acetaminophen] Nausea And Vomiting and Other (See Comments)    REACTION: Upsets stomach/causes pain     Social History:  reports that she has been smoking Cigarettes.  She has a 30 pack-year smoking history. She has never used smokeless tobacco. She reports that she uses illicit drugs (Marijuana). She reports that she does not drink alcohol.   Family History: No family history on file.  Family history reviewed and not pertinent    Physical Exam: Filed Vitals:   07/06/15 1348 07/06/15 1400 07/06/15 1445 07/06/15 1500  BP:  119/66 120/78 106/60  Pulse:   83 87  Temp:      TempSrc:      Resp: 27 27 17 17   SpO2:   100% 100%    Constitutional: Appears calm,  Alert and awake, oriented x3, not in any acute distress. Anxious appearing Eyes: PERLA, EOMI, irises appear normal, anicteric sclera,  ENMT: external ears and nose appear normal, normal hearing or hard of hearing Lips appears normal, oropharynx mucosa, tongue, posterior pharynx appear normal    Neck: neck appears normal, no masses, normal ROM, no thyromegaly, no JVD  CVS: S1-S2 clear, no murmur rubs or gallops, no LE edema, normal pedal pulses  Respiratory: clear to auscultation bilaterally, no wheezing, rales or rhonchi. Respiratory effort normal. No accessory muscle use.  Abdomen: soft, obese, somewhat tender, nondistended, active bowel sounds, no hepatosplenomegaly, no hernias  Musculoskeletal: : no cyanosis, clubbing or edema noted bilaterally. Joint/bones/muscle exam, strength, contractures or atrophy Neuro: Cranial nerves II-XII intact, strength, sensation, reflexes Psych: judgement and insight appear normal, stable mood and affect, mental status Skin: petechial rash noted on the extremities, right porterior chest with tick bite, no apparent target sign.    Labs:  CBC:  Recent Labs Lab 07/06/15 1054  WBC 13.5*  HGB 18.2*  HCT 54.8*  MCV 94.5  PLT [id number]    Basic Metabolic Panel:  Recent Labs Lab 07/06/15 1054  NA 136  K 4.7  CL 105  CO2 21*  GLUCOSE 139*  BUN 8  CREATININE 0.77  CALCIUM 9.3   GFR CrCl cannot be calculated (Unknown ideal weight.). Liver Function Tests: No results for input(s): AST, ALT, ALKPHOS, BILITOT, PROT, ALBUMIN in the last 168 hours.  Recent Labs Lab 07/06/15 1258  LIPASE 18   No results for input(s): AMMONIA in the last 168 hours. Coagulation profile  Recent Labs Lab 07/06/15 1054  INR 1.00   Anemia work up No results for input(s): VITAMINB12, FOLATE, FERRITIN, TIBC, IRON, RETICCTPCT in the last 72 hours. Urinalysis    Component Value Date/Time   COLORURINE YELLOW 09/13/2013 2051   APPEARANCEUR CLOUDY* 09/13/2013 2051   LABSPEC 1.014 09/13/2013 2051   PHURINE 6.0 09/13/2013 2051   GLUCOSEU NEGATIVE 09/13/2013 2051   HGBUR LARGE* 09/13/2013 2051   [place] NEGATIVE 09/13/2013 2051   [place] NEGATIVE 09/13/2013 2051   PROTEINUR NEGATIVE 09/13/2013 2051   UROBILINOGEN 1.0 09/13/2013 2051   NITRITE NEGATIVE  09/13/2013 2051   LEUKOCYTESUR LARGE* 09/13/2013 2051     Sepsis Labs Invalid input(s): PROCALCITONIN,  WBC,  LACTICIDVEN Microbiology No results found for this or any previous visit (from the past 240 hour(s)).     Inpatient Medications:   Scheduled Meds: . aspirin  324 mg Oral Once   Continuous Infusions: . nitroGLYCERIN Stopped (07/06/15 1454)     Radiological Exams on Admission: Ct Abdomen Pelvis W Contrast  07/06/2015  CLINICAL DATA:  Patient with generalized abdominal pain for 2 days. Nausea vomiting and diarrhea. EXAM: CT ABDOMEN AND PELVIS WITH CONTRAST TECHNIQUE: Multidetector CT imaging of the abdomen and pelvis was performed using the standard protocol following bolus administration of intravenous contrast. CONTRAST:  179mL ISOVUE-300 IOPAMIDOL (ISOVUE-300) INJECTION 61% COMPARISON:  CT abdomen pelvis 06/12/2014 FINDINGS: Lower chest: Normal heart size. Dependent atelectasis within the bilateral lower lobes. No pleural effusion. Hepatobiliary: Liver is normal in size and contour. No focal hepatic lesions identified. Gallbladder surgically absent. Pancreas: Unremarkable Spleen: Unremarkable Adrenals/Urinary Tract: Stable bilateral adrenal nodularity. Kidneys enhance symmetrically with contrast. Too small to characterize low-attenuation lesion superior pole right kidney. No hydronephrosis. Urinary bladder is decompressed. Stomach/Bowel: No abnormal bowel wall thickening or evidence for bowel obstruction. No free fluid or free intraperitoneal air. Vascular/Lymphatic: Normal caliber abdominal aorta. No retroperitoneal lymphadenopathy. Other: Uterus and adnexal structures are unremarkable. Musculoskeletal: Lumbar spine degenerative changes. No aggressive or acute appearing osseous lesions. IMPRESSION: No acute process within the abdomen or pelvis. Unchanged bilateral adrenal nodularity. Electronically Signed   By: Lovey Newcomer M.D.   On: 07/06/2015 14:18   Dg Chest Portable 1  View  07/06/2015  CLINICAL DATA:  Short of breath and chest pain. CHF. Hypertension. Prior myocardial infarction. EXAM: PORTABLE CHEST 1 VIEW COMPARISON:  04/09/2015 FINDINGS: Numerous leads and wires project over the chest. Midline trachea. Normal heart size for level of inspiration. No pleural effusion or pneumothorax. Chest wall and breast tissues project over the lung bases. No definite consolidation. IMPRESSION: No acute cardiopulmonary disease. Electronically Signed   By: Abigail Miyamoto M.D.   On: 07/06/2015 11:12    Impression/Recommendations Active Problems:   Chest pain   Chest pain syndrome, cardiac versus musculoskeletal vs anxiety EKG shows  R QTC 471. Tn 0.01. She is currently chest pain free.EKG was discussed with Dr. Gwenlyn Found, cardiology, concluding thaT findings  were not consistent with STEMI  Repeat troponin If negative, patient may ne discharged and follow up with OP cardiologist  Diarrhea, likely due to Viral Gastroenteritis. CT abdomen and pelvis negative for acute disease. Patient adamant to leave hospital, wishes to treat conservatly as OP Recommend Fluid replenishment  Cear liquid diet and advance as tolerated Start imodium  Probiotics She can return if symptoms worsen  Tick bite: Patient had a tick bite on her right upper back  3 days ago, which is still visible, and minimal petechial rash in all extremities.  SHe denies at this time any nuchal tenderness or rigidity, vision changes or headaches.She is afebrile.   Admit to obs was recommended for further management. She adamantly  Refuses any further workup stating "If you don't let me out of here I am going to rip all the stuff out of me and storm out of the front door" Recommend Tick panel  oral doxycyclines outpatient for 21 days.    Other medical issues as per ED and outpatient teams  Thank you for this consultation.     Sharene Butters E M.D. [facility] 07/06/2015, 3:29 PM

## 2015-07-06 NOTE — ED Notes (Signed)
Pt to ED via [place], from home with c/o chest pain that started at 0900-- nausea/vomiting and diarrhea also.

## 2015-07-06 NOTE — ED Notes (Signed)
Pt requesting to go home, hospitalist notified.

## 2015-07-06 NOTE — ED Notes (Signed)
MD at bedside. 

## 2015-07-06 NOTE — ED Provider Notes (Signed)
CSN: [id number]     Arrival date & time 07/06/15  1043 History   First MD Initiated Contact with Patient 07/06/15 1045     No chief complaint on file.    (Consider location/radiation/quality/duration/timing/severity/associated sxs/prior Treatment) HPI 45 y.o. Female history of mi, presents with chest pain began this am. [facility] called prehospital stemi, based on pain and st elevation inferior leads prehospital ekg.  Patient given one nitro but pain still 8/10.  She endorses some dyspnea. Discussed with Dr. Gwenlyn Found and ekg changes here not c.w. Stemi.  STEMI canceled. Patient states now that pain began in her abdomen with nausea and vomiting this morning. She then had some pain in her chest. She states that she has also had diarrhea. She had a normal meal last night. Today she has not been able to tolerate anything. She has not had any symptoms like this since she had her gallbladder removed. That was over a year ago. She denies fever or chills.  Past Medical History  Diagnosis Date  . Anxiety   . Cardiac angina ([place])   . Hypertension   . Panic attack   . Bipolar 1 disorder ([place])   . Myocardial infarction [facility])     2005 and 2002  stents placed both times  . Shortness of breath   . Peripheral vascular disease ([place])     pt states both legs  . CHF (congestive heart failure) ([place])     years ago - per pt  . GERD (gastroesophageal reflux disease)     no recent problems-no meds needed  . Arthritis     both wrists  . Pain     left wrist -pt fell sat 03/25/12 and fractured left wrist.  has splint--no other injuries  . Complication of anesthesia     pt states she was told she woke up during some surgeries-but she did not have any recall or awareness  . Coronary artery disease     Dr. Debara Pickett with [facility] is pt's cardiologist  . Depression     pt states "major depression"  . Gallbladder disease    Past Surgical History  Procedure Laterality Date  . Cardiac  catheterization    . Wrist surgery    . Cesarean section    . Dental surgery    . Tmj arthroplasty    . Coronary angioplasty      stents  . Open reduction internal fixation (orif) distal radial fracture  03/31/2012    Procedure: OPEN REDUCTION INTERNAL FIXATION (ORIF) DISTAL RADIAL FRACTURE;  Surgeon: Mcarthur Rossetti, MD;  Location: [facility] ORS;  Service: Orthopedics;  Laterality: Left;  Open Reduction Internal Fixation left distal radius fracture  . Tubal ligation    . Cholecystectomy N/A 09/14/2013    Procedure: LAPAROSCOPIC CHOLECYSTECTOMY WITH INTRAOPERATIVE CHOLANGIOGRAM;  Surgeon: Adin Hector, MD;  Location: [facility] ORS;  Service: General;  Laterality: N/A;   No family history on file. Social History  Substance Use Topics  . Smoking status: Current Every Day Smoker -- 1.00 packs/day for 30 years    Types: Cigarettes  . Smokeless tobacco: Never Used  . Alcohol Use: No   OB History    No data available     Review of Systems  All other systems reviewed and are negative.     Allergies  Buprenorphine hcl; Codeine; Hydrocodone; Morphine and related; Norco; Ciprofloxacin; Penicillins; and Tylenol  Home Medications   Prior to Admission medications   Medication Sig  Start Date End Date Taking? Authorizing Provider  albuterol (PROVENTIL HFA;VENTOLIN HFA) 108 (90 Base) MCG/ACT inhaler Inhale 2 puffs into the lungs 4 (four) times daily. 10/09/14 10/09/15  Historical Provider, MD  alprazolam Duanne Moron) 2 MG tablet Take 2 mg by mouth 2 (two) times daily.    Historical Provider, MD  ARIPiprazole (ABILIFY) 15 MG tablet Take 7.5-15 mg by mouth daily.    Historical Provider, MD  aspirin 325 MG tablet Take 325 mg by mouth every morning.    Historical Provider, MD  atorvastatin (LIPITOR) 80 MG tablet Take 80 mg by mouth daily.    Historical Provider, MD  carvedilol (COREG) 12.5 MG tablet Take 12.5 mg by mouth 2 (two) times daily. 10/09/14 10/09/15  Historical Provider, MD  estazolam (PROSOM) 2  MG tablet Take 2 mg by mouth at bedtime.    Historical Provider, MD  Eszopiclone 3 MG TABS Take 3 mg by mouth at bedtime.    Historical Provider, MD  furosemide (LASIX) 20 MG tablet Take 20 mg by mouth daily.    Historical Provider, MD  glipiZIDE (GLUCOTROL) 5 MG tablet Take 5 mg by mouth daily.    Historical Provider, MD  lisinopril (PRINIVIL,ZESTRIL) 40 MG tablet Take 40 mg by mouth daily.    Historical Provider, MD  mirtazapine (REMERON SOL-TAB) 30 MG disintegrating tablet Take 60 mg by mouth at bedtime.    Historical Provider, MD  montelukast (SINGULAIR) 10 MG tablet Take 10 mg by mouth every evening.    Historical Provider, MD  nitroGLYCERIN (NITROSTAT) 0.4 MG SL tablet Place 0.4 mg under the tongue every 5 (five) minutes as needed for chest pain.    Historical Provider, MD  promethazine (PHENERGAN) 25 MG tablet Take 25 mg by mouth every 4 (four) hours as needed for nausea or vomiting.    Historical Provider, MD  ticagrelor (BRILINTA) 90 MG TABS tablet Take 90 mg by mouth 2 (two) times daily.    Historical Provider, MD   There were no vitals taken for this visit. Physical Exam  Constitutional: She is oriented to person, place, and time. She appears well-nourished.  HENT:  Head: Normocephalic and atraumatic.  Right Ear: External ear normal.  Left Ear: External ear normal.  Nose: Nose normal.  Mouth/Throat: Oropharynx is clear and moist.  Eyes: Conjunctivae and EOM are normal. Pupils are equal, round, and reactive to light.  Neck: Normal range of motion.  Cardiovascular: Normal rate.   Pulmonary/Chest: Effort normal.  Abdominal: Soft. Bowel sounds are normal.  Musculoskeletal: Normal range of motion. She exhibits no edema or tenderness.  Neurological: She is alert and oriented to person, place, and time.  Skin: Skin is warm. She is diaphoretic.  Psychiatric: She has a normal mood and affect.  Nursing note and vitals reviewed.   ED Course  Procedures (including critical care  time) Labs Review Labs Reviewed  BASIC METABOLIC PANEL - Abnormal; Notable for the following:    CO2 21 (*)    Glucose, Bld 139 (*)    All other components within normal limits  CBC - Abnormal; Notable for the following:    WBC 13.5 (*)    RBC 5.80 (*)    Hemoglobin 18.2 (*)    HCT 54.8 (*)    All other components within normal limits  PROTIME-INR  APTT  I-STAT TROPOININ, ED    Imaging Review Dg Chest Portable 1 View  07/06/2015  CLINICAL DATA:  Short of breath and chest pain. CHF. Hypertension. Prior myocardial infarction.  EXAM: PORTABLE CHEST 1 VIEW COMPARISON:  04/09/2015 FINDINGS: Numerous leads and wires project over the chest. Midline trachea. Normal heart size for level of inspiration. No pleural effusion or pneumothorax. Chest wall and breast tissues project over the lung bases. No definite consolidation. IMPRESSION: No acute cardiopulmonary disease. Electronically Signed   By: Abigail Miyamoto M.D.   On: 07/06/2015 11:12   I have personally reviewed and evaluated these images and lab results as part of my medical decision-making.   EKG Interpretation   Date/Time:  Sunday July 06 2015 10:50:24 EDT Ventricular Rate:  98 PR Interval:  131 QRS Duration: 96 QT Interval:  369 QTC Calculation: 471 R Axis:   3 Text Interpretation:  Sinus rhythm Ventricular premature complex Abnormal  R-wave progression, early transition Probable inferior infarct, old  nonspecific st changes inferior leads Confirmed by Tylie Golonka MD, Andee Poles  [phone]) on 07/06/2015 12:46:33 PM      MDM   Final diagnoses:  Chest pain, unspecified chest pain type  Nausea vomiting and diarrhea    Patient given iv fentanyl with some relief.  Given history of mi and angioplasty, plan admission for ongoing pain control, and cardiac evaluation.  Patient with ongoing nausea and vomiting and complaints of pain in the upper abdomen and chest. CT without any evidence of acute intra-abdominal abnormality. Repeat troponin  remains negative.  Discussed with Dr. Aggie Moats and he will admit.   Pattricia Boss, MD 07/08/15 [phone]

## 2015-07-15 ENCOUNTER — Other Ambulatory Visit (HOSPITAL_COMMUNITY): Payer: Self-pay | Admitting: Gastroenterology

## 2015-07-15 DIAGNOSIS — B182 Chronic viral hepatitis C: Secondary | ICD-10-CM

## 2015-07-31 ENCOUNTER — Ambulatory Visit (HOSPITAL_COMMUNITY): Payer: Medicaid Other

## 2015-09-03 ENCOUNTER — Ambulatory Visit (HOSPITAL_COMMUNITY)
Admission: RE | Admit: 2015-09-03 | Discharge: 2015-09-03 | Disposition: A | Discharge status: Home / Self Care | Payer: Medicaid Other | Source: Ambulatory Visit | Attending: Gastroenterology | Admitting: Gastroenterology

## 2015-09-03 DIAGNOSIS — B182 Chronic viral hepatitis C: Secondary | ICD-10-CM | POA: Diagnosis present

## 2016-01-21 DEATH — deceased

## 2017-01-26 IMAGING — CT CT ABD-PELV W/ CM
2 of 5 series · 17 of 46 positions shown, 19 images · IV contrast (APPLIED)
Comparison: CT abdomen pelvis 06/12/2014

CLINICAL DATA: Patient with generalized abdominal pain for 2 days.
Nausea vomiting and diarrhea.

EXAM:
CT ABDOMEN AND PELVIS WITH CONTRAST
TECHNIQUE: Multidetector CT imaging of the abdomen and pelvis was performed
using the standard protocol following bolus administration of
intravenous contrast.
CONTRAST:  100mL E20GVS-NNN IOPAMIDOL (E20GVS-NNN) INJECTION 61%

[Series 2: abd/ pelvis 5.0 i30f 1 · axial · 0.91mm/px · z∈[+833,+1253]mm · 14 of 94 slices shown, 16 images]
[im 5/94  soft-tissue]
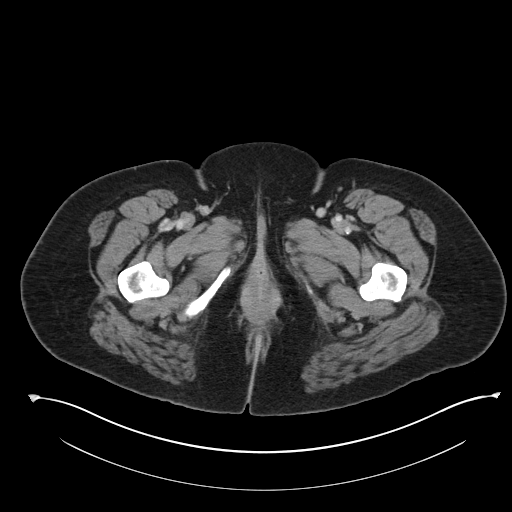
[im 5/94  bone]
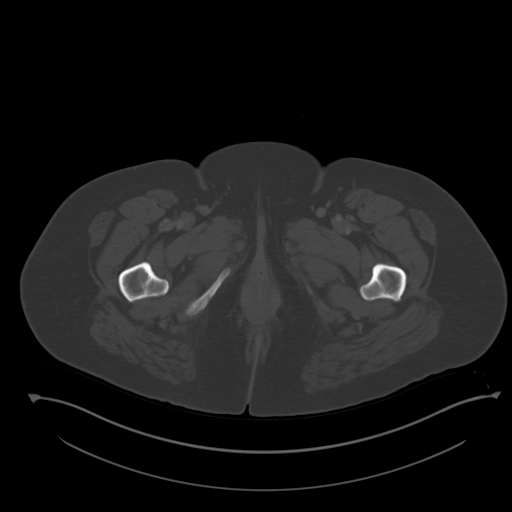
[im 10/94  soft-tissue]
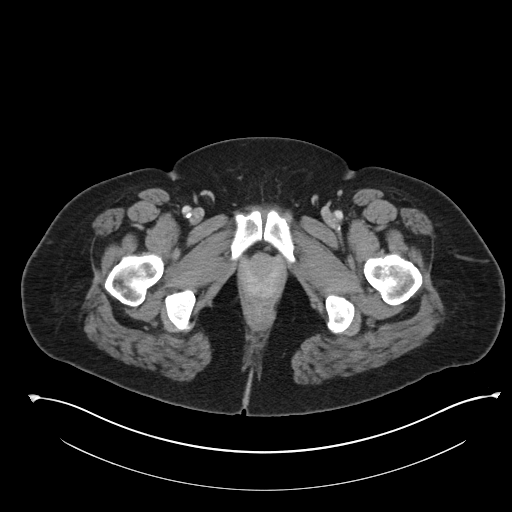
[im 20/94  soft-tissue]
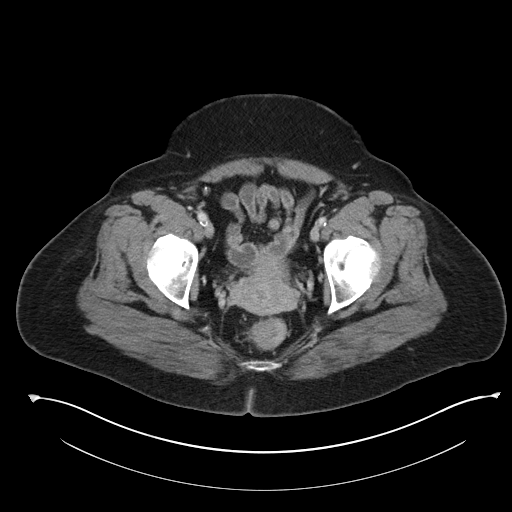
[im 25/94  soft-tissue]
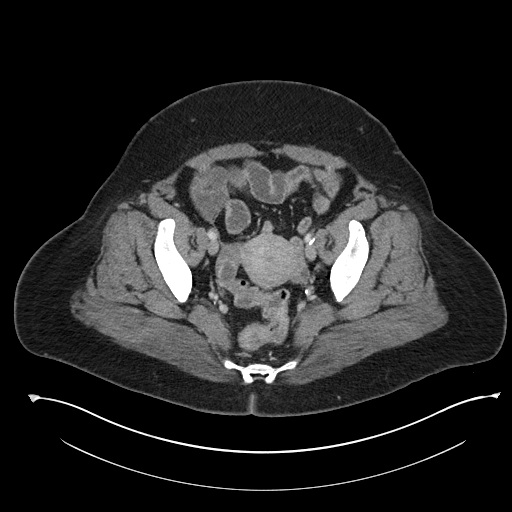
[im 30/94  soft-tissue]
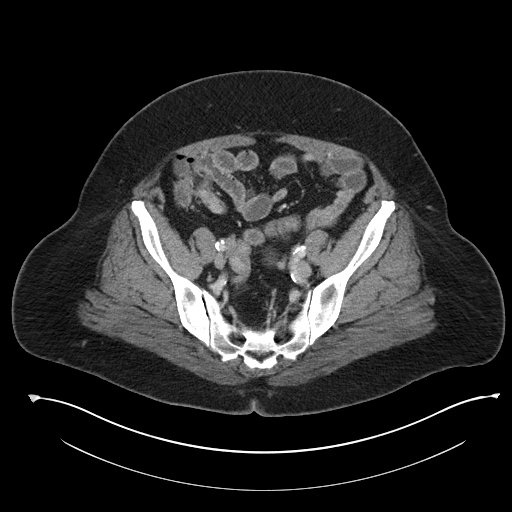
[im 40/94  soft-tissue]
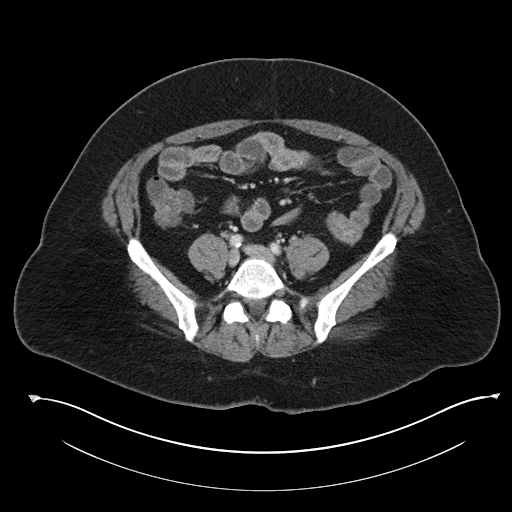
[im 45/94  soft-tissue]
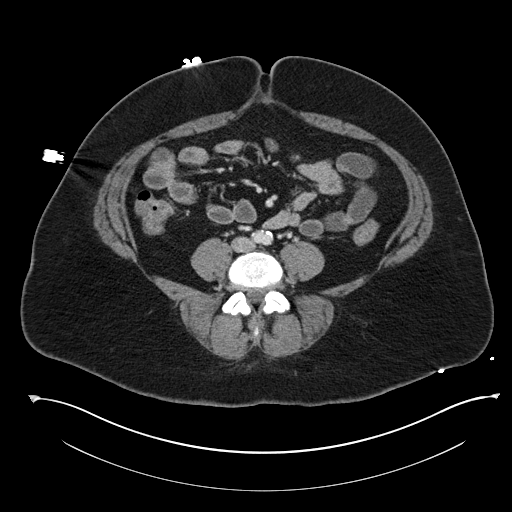
[im 49/94  soft-tissue]
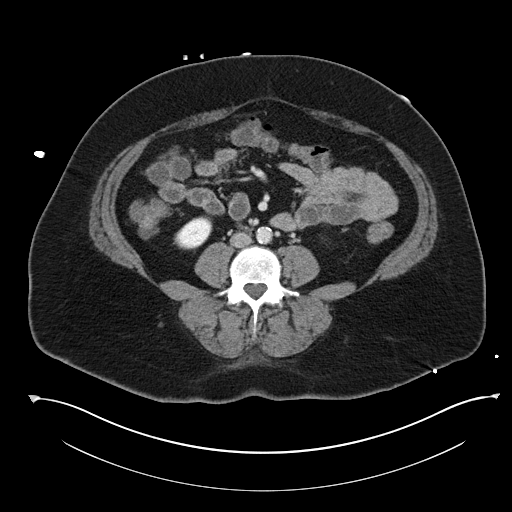
[im 54/94  soft-tissue]
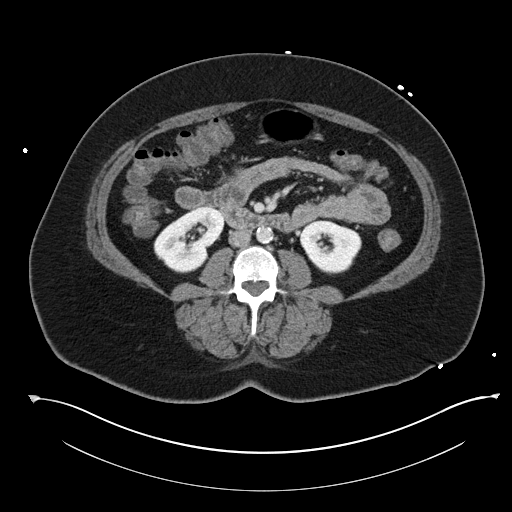
[im 54/94  bone]
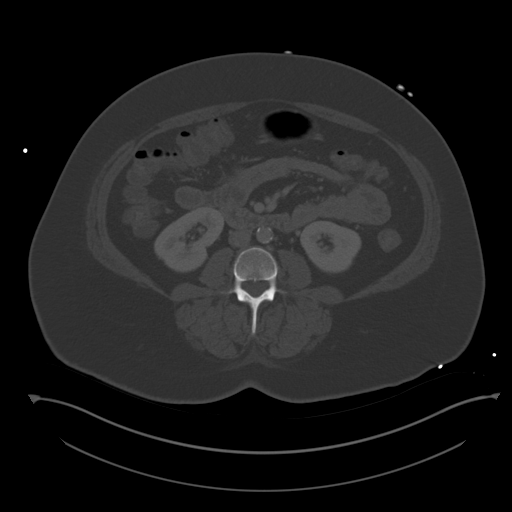
[im 64/94  soft-tissue]
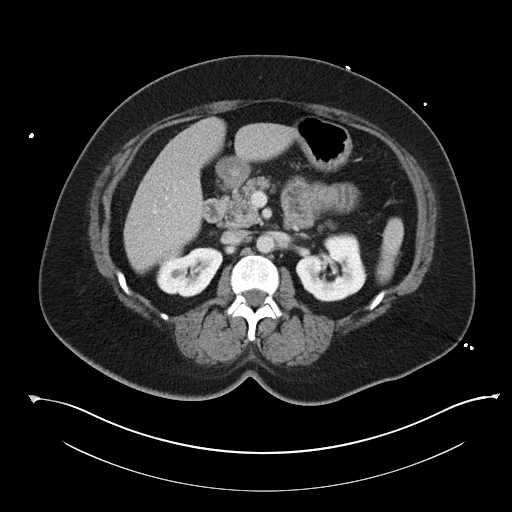
[im 69/94  soft-tissue]
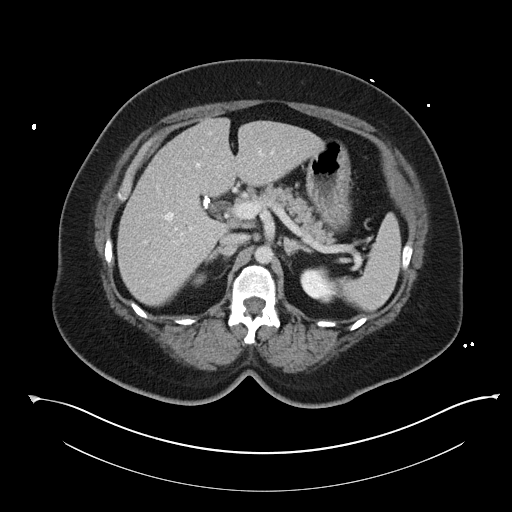
[im 74/94  soft-tissue]
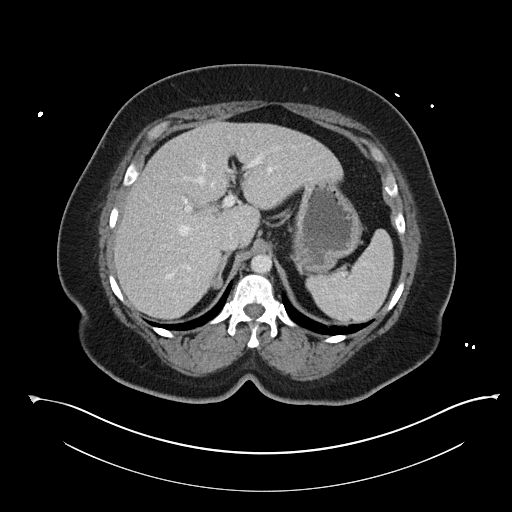
[im 84/94  soft-tissue]
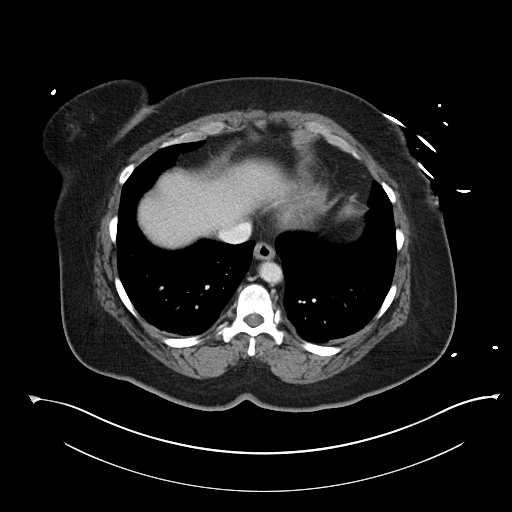
[im 89/94  soft-tissue]
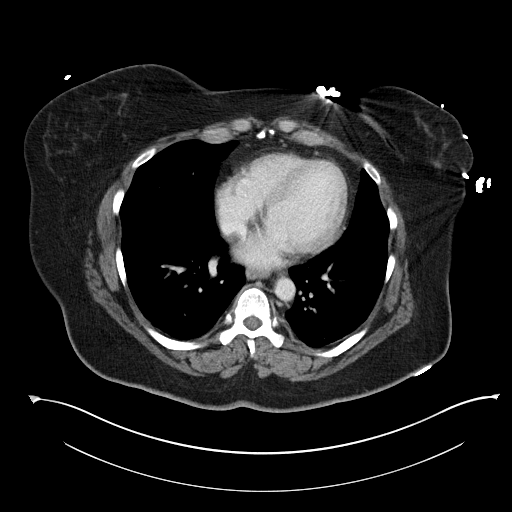

[Series 5: coronal soft tissue · coronal · 0.85mm/px · 3 of 102 slices shown]
[im 34/102  soft-tissue]
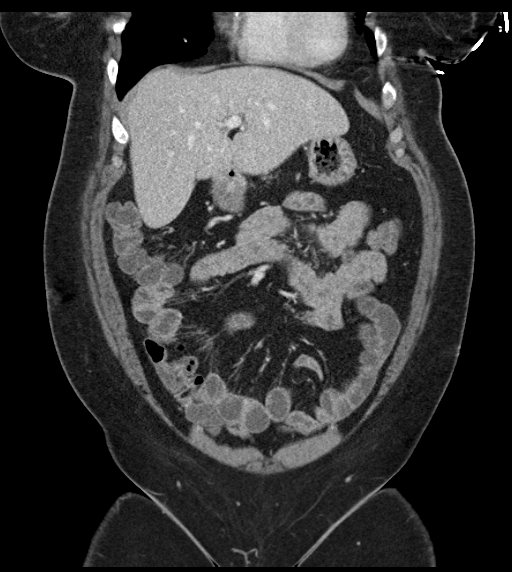
[im 45/102  soft-tissue]
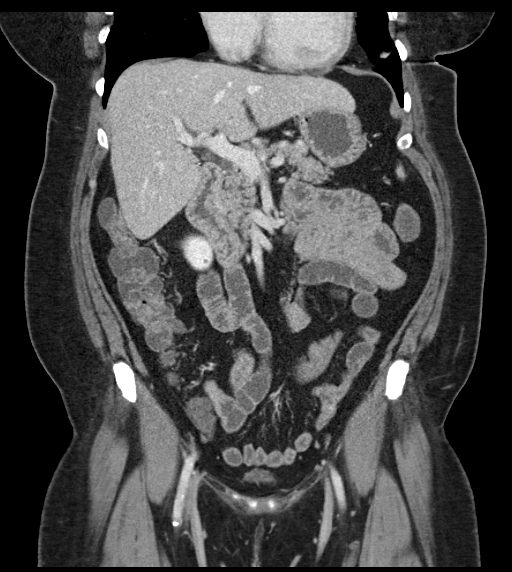
[im 57/102  soft-tissue]
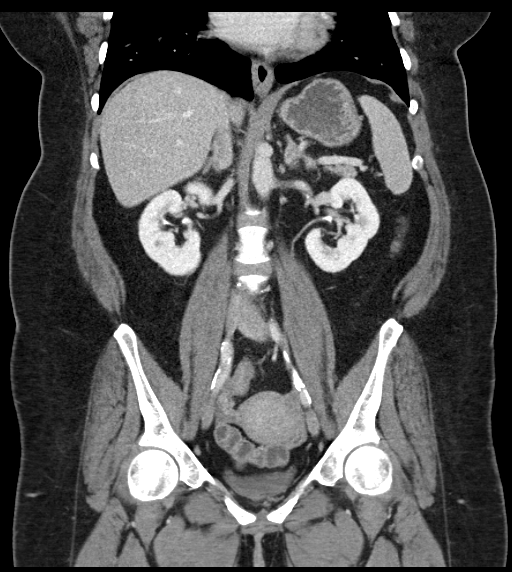

[17 of 46 positions shown; findings below may reference images not displayed]

FINDINGS: Lower chest: Normal heart size. Dependent atelectasis within the
bilateral lower lobes. No pleural effusion.

Hepatobiliary: Liver is normal in size and contour. No focal hepatic
lesions identified. Gallbladder surgically absent.

Pancreas: Unremarkable

Spleen: Unremarkable

Adrenals/Urinary Tract: Stable bilateral adrenal nodularity. Kidneys
enhance symmetrically with contrast. Too small to characterize
low-attenuation lesion superior pole right kidney. No
hydronephrosis. Urinary bladder is decompressed.

Stomach/Bowel: No abnormal bowel wall thickening or evidence for
bowel obstruction. No free fluid or free intraperitoneal air.

Vascular/Lymphatic: Normal caliber abdominal aorta. No
retroperitoneal lymphadenopathy.

Other: Uterus and adnexal structures are unremarkable.

Musculoskeletal: Lumbar spine degenerative changes. No aggressive or
acute appearing osseous lesions.
IMPRESSION: No acute process within the abdomen or pelvis.

Unchanged bilateral adrenal nodularity.
